# Patient Record
Sex: Female | Born: 1955 | Race: White | Hispanic: No | Marital: Married | Smoking: Never smoker
Health system: Southern US, Community
[De-identification: ages and names within clinical notes are randomized; demographics above are authoritative.]

## PROBLEM LIST (undated history)

## (undated) DIAGNOSIS — I10 Essential (primary) hypertension: Secondary | ICD-10-CM

## (undated) DIAGNOSIS — E119 Type 2 diabetes mellitus without complications: Secondary | ICD-10-CM

---

## 1999-01-12 ENCOUNTER — Encounter: Admission: RE | Admit: 1999-01-12 | Discharge: 1999-04-12 | Payer: Self-pay | Admitting: Family Medicine

## 2005-08-09 ENCOUNTER — Other Ambulatory Visit: Admission: RE | Admit: 2005-08-09 | Discharge: 2005-08-09 | Payer: Self-pay | Admitting: Family Medicine

## 2011-01-17 ENCOUNTER — Other Ambulatory Visit: Payer: Self-pay | Admitting: Family Medicine

## 2011-01-17 DIAGNOSIS — R0989 Other specified symptoms and signs involving the circulatory and respiratory systems: Secondary | ICD-10-CM

## 2011-01-22 ENCOUNTER — Ambulatory Visit
Admission: RE | Admit: 2011-01-22 | Discharge: 2011-01-22 | Disposition: A | Discharge status: Home / Self Care | Payer: BC Managed Care – PPO | Source: Ambulatory Visit | Attending: Family Medicine | Admitting: Family Medicine

## 2011-01-22 DIAGNOSIS — R0989 Other specified symptoms and signs involving the circulatory and respiratory systems: Secondary | ICD-10-CM

## 2012-12-03 IMAGING — US US CAROTID DUPLEX BILAT
1 series · 13 of 24 positions shown · non-contrast
Comparison: None available

CLINICAL DATA: Hypertension, diabetes

BILATERAL CAROTID DUPLEX ULTRASOUND
TECHNIQUE: Gray scale imaging, color Doppler and duplex ultrasound
was performed of bilateral carotid and vertebral arteries in the
neck.

[Series 1: us carotid duplex bilat · 0.08mm/px · 13 of 52 slices shown]
[im 1/52]
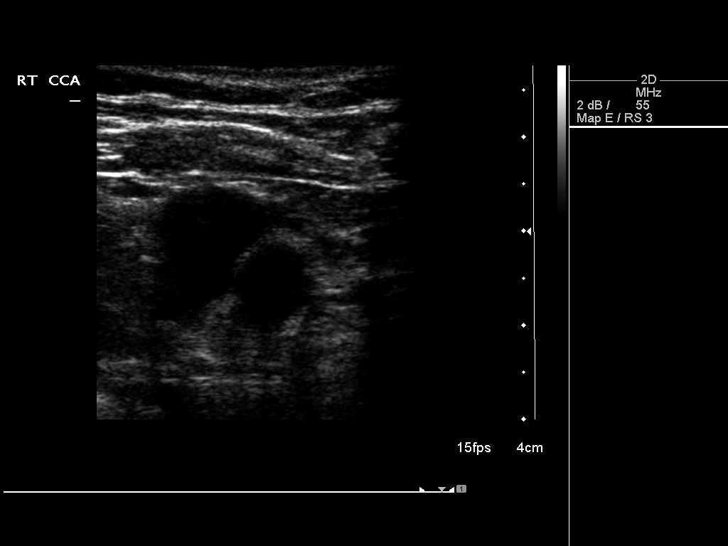
[im 5/52]
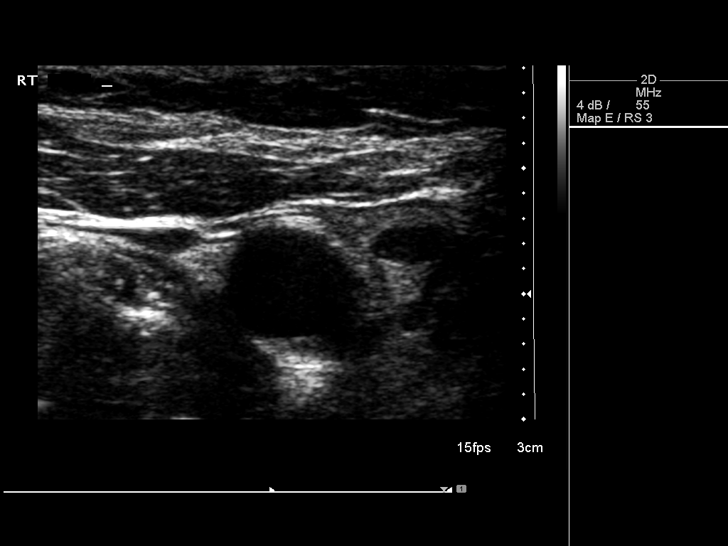
[im 9/52]
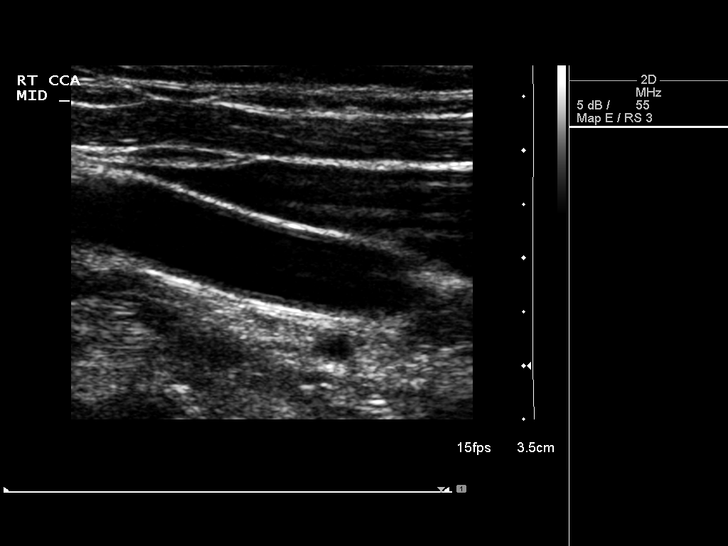
[im 14/52]
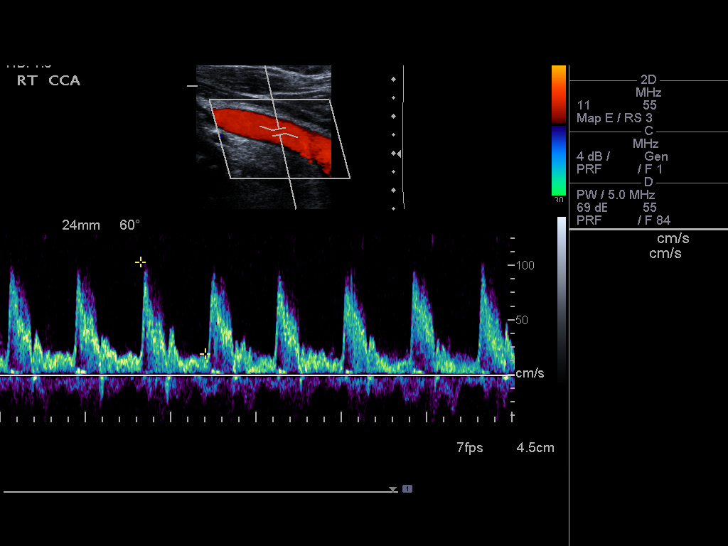
[im 18/52]
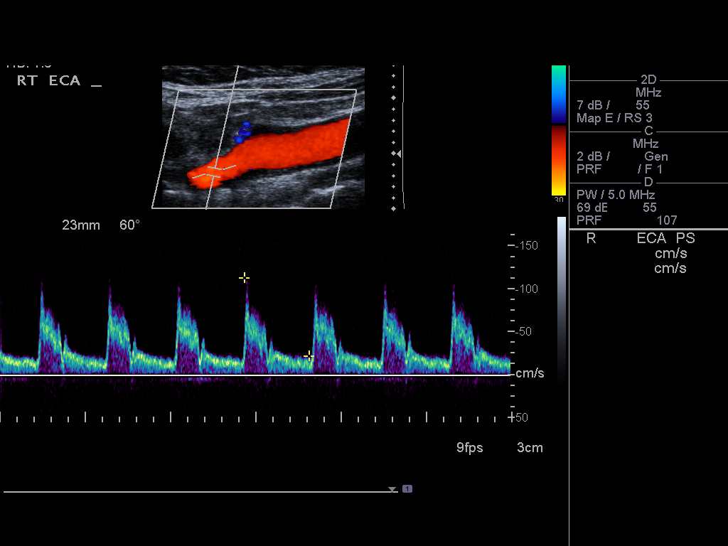
[im 23/52]
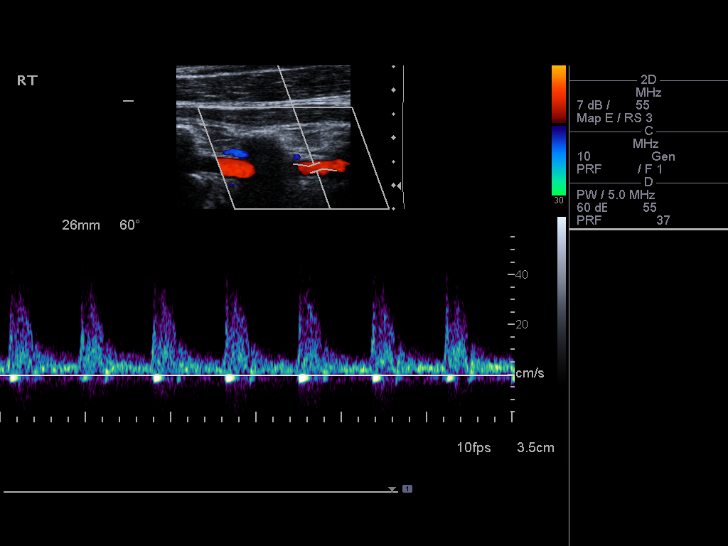
[im 27/52]
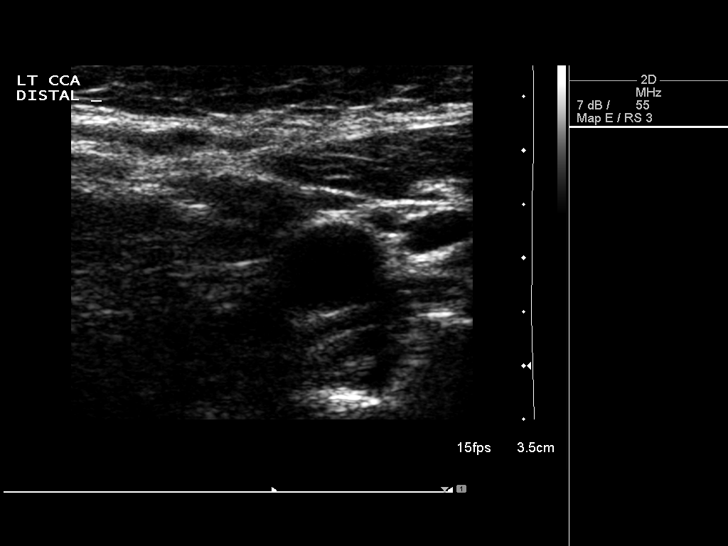
[im 29/52]
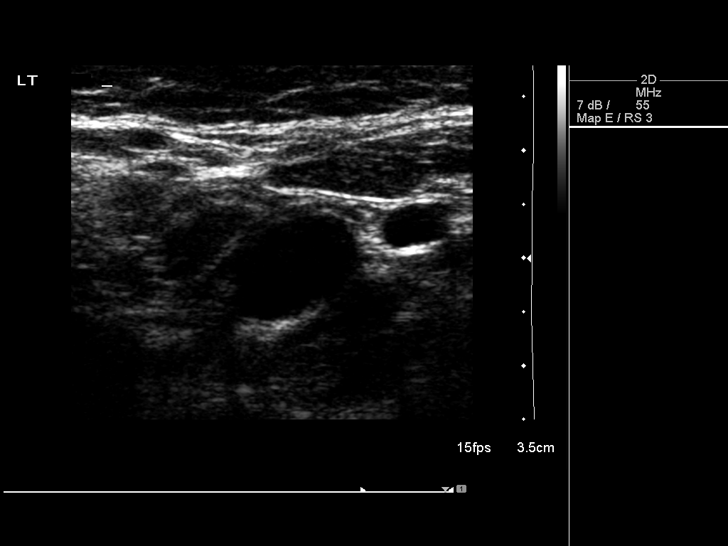
[im 34/52]
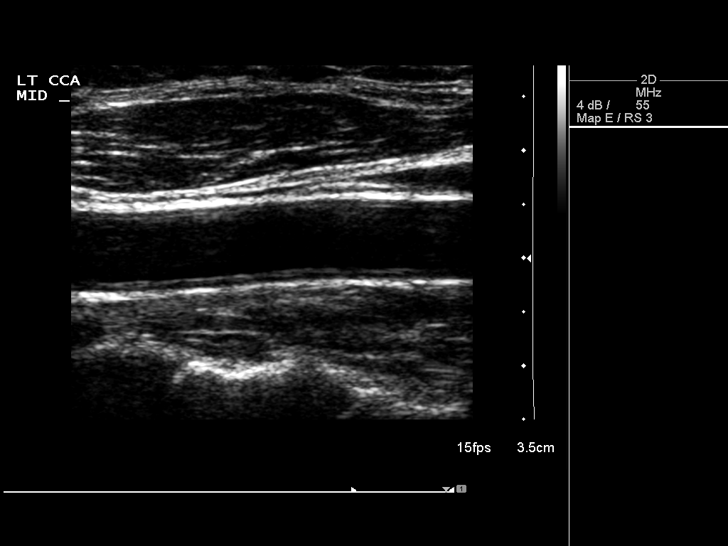
[im 38/52]
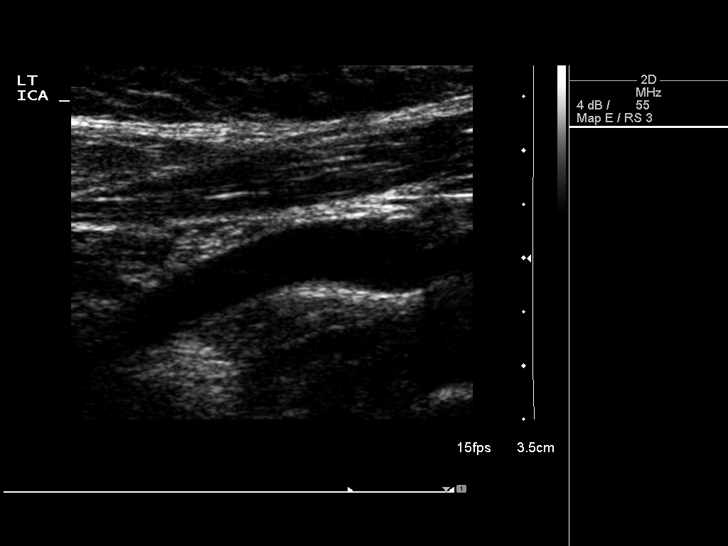
[im 43/52]
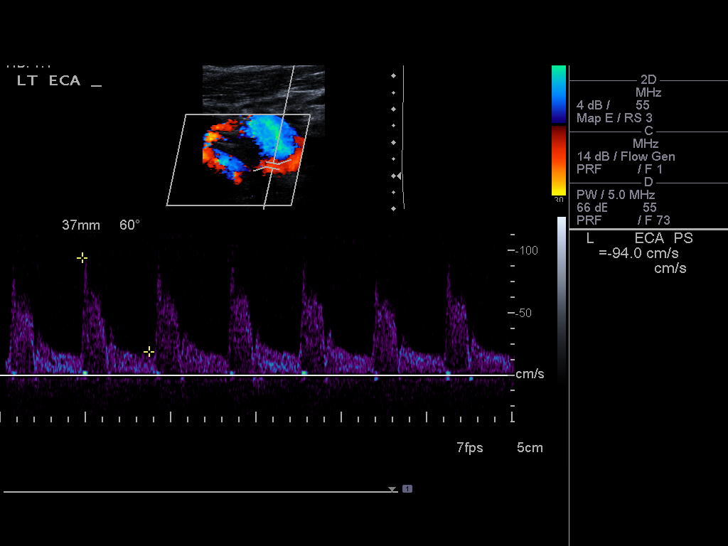
[im 47/52]
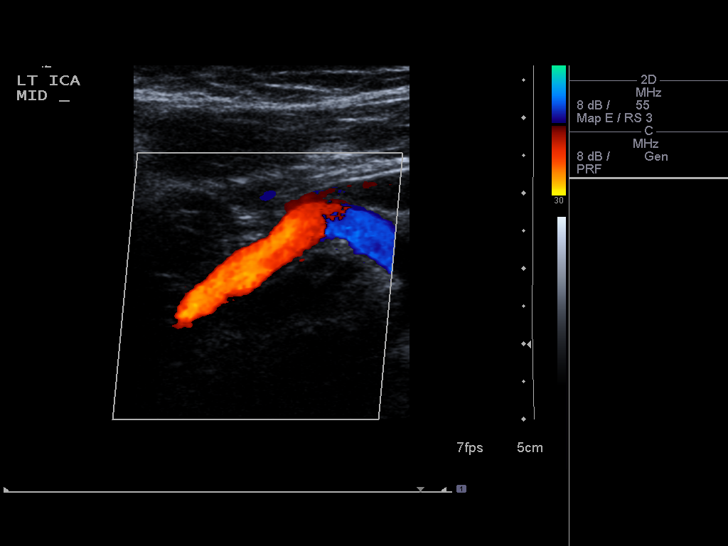
[im 52/52]
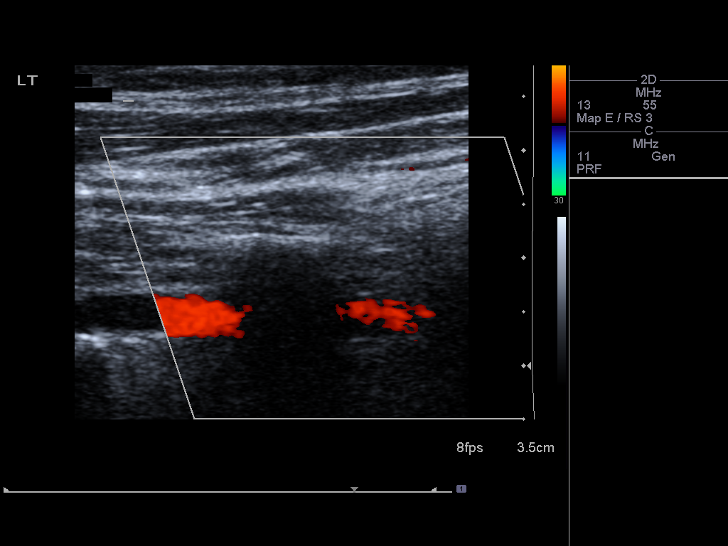

[13 of 24 positions shown; findings below may reference images not displayed]

Criteria:  Quantification of carotid stenosis is based on velocity
parameters that correlate the residual internal carotid diameter
with NASCET-based stenosis levels, using the diameter of the distal
internal carotid lumen as the denominator for stenosis measurement.

The following velocity measurements were obtained:

                 PEAK SYSTOLIC/END DIASTOLIC
RIGHT
ICA:                        78/29cm/sec
CCA:                        92/19cm/sec
SYSTOLIC ICA/CCA RATIO:
DIASTOLIC ICA/CCA RATIO:
ECA:                        114cm/sec

LEFT
ICA:                        70/22cm/sec
CCA:                        112/26cm/sec
SYSTOLIC ICA/CCA RATIO:
DIASTOLIC ICA/CCA RATIO:
ECA:                        94cm/sec
FINDINGS: RIGHT CAROTID ARTERY: No significant plaque or stenosis.  Normal
wave forms and color Doppler signal.

RIGHT VERTEBRAL ARTERY:  Normal flow direction and waveform.

LEFT CAROTID ARTERY: No significant plaque or stenosis.  Normal
wave forms and color Doppler signal.  Mild tortuosity of the ICA.

LEFT VERTEBRAL ARTERY:  Normal flow direction and waveform.
IMPRESSION: 1.  No significant carotid bifurcation plaque or stenosis.

## 2012-12-08 ENCOUNTER — Other Ambulatory Visit: Payer: Self-pay | Admitting: Family Medicine

## 2012-12-08 ENCOUNTER — Other Ambulatory Visit (HOSPITAL_COMMUNITY)
Admission: RE | Admit: 2012-12-08 | Discharge: 2012-12-08 | Disposition: A | Discharge status: Home / Self Care | Payer: BC Managed Care – PPO | Source: Ambulatory Visit | Attending: Family Medicine | Admitting: Family Medicine

## 2012-12-08 DIAGNOSIS — Z1151 Encounter for screening for human papillomavirus (HPV): Secondary | ICD-10-CM | POA: Insufficient documentation

## 2012-12-08 DIAGNOSIS — Z124 Encounter for screening for malignant neoplasm of cervix: Secondary | ICD-10-CM | POA: Insufficient documentation

## 2013-07-02 ENCOUNTER — Other Ambulatory Visit: Payer: Self-pay | Admitting: Dermatology

## 2013-07-15 ENCOUNTER — Other Ambulatory Visit: Payer: Self-pay | Admitting: Dermatology

## 2017-05-13 ENCOUNTER — Other Ambulatory Visit: Payer: Self-pay | Admitting: Family Medicine

## 2017-05-13 ENCOUNTER — Other Ambulatory Visit (HOSPITAL_COMMUNITY)
Admission: RE | Admit: 2017-05-13 | Discharge: 2017-05-13 | Disposition: A | Discharge status: Home / Self Care | Payer: BC Managed Care – PPO | Source: Ambulatory Visit | Attending: Family Medicine | Admitting: Family Medicine

## 2017-05-13 DIAGNOSIS — Z124 Encounter for screening for malignant neoplasm of cervix: Secondary | ICD-10-CM | POA: Diagnosis present

## 2017-05-14 LAB — CYTOLOGY - PAP
DIAGNOSIS: NEGATIVE
HPV (WINDOPATH): NOT DETECTED

## 2018-07-02 ENCOUNTER — Other Ambulatory Visit: Payer: Self-pay | Admitting: Family Medicine

## 2018-07-02 DIAGNOSIS — E2839 Other primary ovarian failure: Secondary | ICD-10-CM

## 2018-08-22 ENCOUNTER — Ambulatory Visit
Admission: RE | Admit: 2018-08-22 | Discharge: 2018-08-22 | Disposition: A | Discharge status: Home / Self Care | Payer: BC Managed Care – PPO | Source: Ambulatory Visit | Attending: Family Medicine | Admitting: Family Medicine

## 2018-08-22 DIAGNOSIS — E2839 Other primary ovarian failure: Secondary | ICD-10-CM

## 2021-02-21 ENCOUNTER — Other Ambulatory Visit: Payer: Self-pay | Admitting: Family Medicine

## 2021-02-21 DIAGNOSIS — R1013 Epigastric pain: Secondary | ICD-10-CM

## 2021-03-08 ENCOUNTER — Ambulatory Visit
Admission: RE | Admit: 2021-03-08 | Discharge: 2021-03-08 | Disposition: A | Discharge status: Home / Self Care | Payer: Medicare Other | Source: Ambulatory Visit | Attending: Family Medicine | Admitting: Family Medicine

## 2021-03-08 DIAGNOSIS — R1013 Epigastric pain: Secondary | ICD-10-CM

## 2021-07-07 ENCOUNTER — Ambulatory Visit: Payer: Self-pay | Admitting: Surgery

## 2021-07-07 NOTE — H&P (Signed)
Leslie Floyd [id number]   Referring Provider:  Laurann Montana, MD   Subjective   Chief Complaint: New Consultation (Gallstones )     History of Present Illness:    Very pleasant 65 year old woman with history of diabetes and hypertension who presents for evaluation of biliary colic.  She has had intermittent right upper quadrant pain and queasiness beginning about a year ago.  After her first episode, she went about 6 months without any symptoms, however recently has had more frequent flares which seem to be typically associated with eating but not necessarily fatty foods, last a few hours and are typically right upper quadrant pain followed by queasiness.  She has not had any previous abdominal surgery.  She had an ultrasound in August that shows multiple intraluminal gallstones the largest in the fundus measuring 2.9 cm.  The gallbladder wall at that time was measured at 5.5 cm but was felt to be an over read, common bile duct was 6 to 7 mm with no ductal stones or filling defects.  Liver appearance consistent with hepatic steatosis.   Review of Systems: A complete review of systems was obtained from the patient.  I have reviewed this information and discussed as appropriate with the patient.  See HPI as well for other ROS.   Medical History: Past Medical History:  Diagnosis Date   Diabetes mellitus without complication (CMS-HCC)    Hyperlipidemia    Hypertension     Patient Active Problem List  Diagnosis   Chronic kidney disease due to hypertension   Chronic kidney disease, stage 2 (mild)   Diabetic renal disease (CMS-HCC)   Gallstones   Hyperlipidemia   Postherpetic neuralgia   Primary insomnia   Reactive depression (situational)   Vitamin D deficiency    Past Surgical History:  Procedure Laterality Date   TONSILLECTOMY       No Known Allergies  Current Outpatient Medications on File Prior to Visit  Medication Sig Dispense Refill   sitaGLIPtin-metFORMIN  (JANUMET) 50-1,000 mg tablet 2 tablets     atorvastatin (LIPITOR) 10 MG tablet Take 1 tablet (10 mg total) by mouth once daily     dapsone 25 MG tablet 1 tablet (25 mg total)     DULoxetine (CYMBALTA) 60 MG DR capsule 1 capsule (60 mg total)     lisinopriL (ZESTRIL) 20 MG tablet Take 1 tablet (20 mg total) by mouth once daily     No current facility-administered medications on file prior to visit.    History reviewed. No pertinent family history.   Social History   Tobacco Use  Smoking Status Never  Smokeless Tobacco Never     Social History   Socioeconomic History   Marital status: Unknown  Tobacco Use   Smoking status: Never   Smokeless tobacco: Never  Substance and Sexual Activity   Alcohol use: Yes   Drug use: Never    Objective:    Vitals:   07/07/21 1611  BP: 132/58  Pulse: 105  Temp: 36.4 C (97.5 F)  SpO2: 97%  Weight: 83 kg (183 lb)    There is no height or weight on file to calculate BMI.  Gen: A&Ox3, no distress  Unlabored respirations Abdomen soft and nontender  Assessment and Plan:  Diagnoses and all orders for this visit:  Biliary colic     I recommend proceeding with laparoscopic or robotic cholecystectomy with possible cholangiogram.  Discussed the relevant anatomy using a diagram to demonstrate, and went over surgical  technique.  Discussed risks of surgery including bleeding, pain, scarring, intraabdominal injury specifically to the common bile duct and sequelae, bile leak, conversion to open surgery, failure to resolve symptoms, blood clots/ pulmonary embolus, heart attack, pneumonia, stroke, death. Questions welcomed and answered to patient's satisfaction.  Kimberle Stanfill Carlye Grippe, MD

## 2021-07-07 NOTE — H&P (View-Only) (Signed)
Priscillia Fouch [id number]   Referring Provider:  Laurann Montana, MD   Subjective   Chief Complaint: New Consultation (Gallstones )     History of Present Illness:    Very pleasant 65 year old woman with history of diabetes and hypertension who presents for evaluation of biliary colic.  She has had intermittent right upper quadrant pain and queasiness beginning about a year ago.  After her first episode, she went about 6 months without any symptoms, however recently has had more frequent flares which seem to be typically associated with eating but not necessarily fatty foods, last a few hours and are typically right upper quadrant pain followed by queasiness.  She has not had any previous abdominal surgery.  She had an ultrasound in August that shows multiple intraluminal gallstones the largest in the fundus measuring 2.9 cm.  The gallbladder wall at that time was measured at 5.5 cm but was felt to be an over read, common bile duct was 6 to 7 mm with no ductal stones or filling defects.  Liver appearance consistent with hepatic steatosis.   Review of Systems: A complete review of systems was obtained from the patient.  I have reviewed this information and discussed as appropriate with the patient.  See HPI as well for other ROS.   Medical History: Past Medical History:  Diagnosis Date   Diabetes mellitus without complication (CMS-HCC)    Hyperlipidemia    Hypertension     Patient Active Problem List  Diagnosis   Chronic kidney disease due to hypertension   Chronic kidney disease, stage 2 (mild)   Diabetic renal disease (CMS-HCC)   Gallstones   Hyperlipidemia   Postherpetic neuralgia   Primary insomnia   Reactive depression (situational)   Vitamin D deficiency    Past Surgical History:  Procedure Laterality Date   TONSILLECTOMY       No Known Allergies  Current Outpatient Medications on File Prior to Visit  Medication Sig Dispense Refill   sitaGLIPtin-metFORMIN  (JANUMET) 50-1,000 mg tablet 2 tablets     atorvastatin (LIPITOR) 10 MG tablet Take 1 tablet (10 mg total) by mouth once daily     dapsone 25 MG tablet 1 tablet (25 mg total)     DULoxetine (CYMBALTA) 60 MG DR capsule 1 capsule (60 mg total)     lisinopriL (ZESTRIL) 20 MG tablet Take 1 tablet (20 mg total) by mouth once daily     No current facility-administered medications on file prior to visit.    History reviewed. No pertinent family history.   Social History   Tobacco Use  Smoking Status Never  Smokeless Tobacco Never     Social History   Socioeconomic History   Marital status: Unknown  Tobacco Use   Smoking status: Never   Smokeless tobacco: Never  Substance and Sexual Activity   Alcohol use: Yes   Drug use: Never    Objective:    Vitals:   07/07/21 1611  BP: 132/58  Pulse: 105  Temp: 36.4 C (97.5 F)  SpO2: 97%  Weight: 83 kg (183 lb)    There is no height or weight on file to calculate BMI.  Gen: A&Ox3, no distress  Unlabored respirations Abdomen soft and nontender  Assessment and Plan:  Diagnoses and all orders for this visit:  Biliary colic     I recommend proceeding with laparoscopic or robotic cholecystectomy with possible cholangiogram.  Discussed the relevant anatomy using a diagram to demonstrate, and went over surgical  technique.  Discussed risks of surgery including bleeding, pain, scarring, intraabdominal injury specifically to the common bile duct and sequelae, bile leak, conversion to open surgery, failure to resolve symptoms, blood clots/ pulmonary embolus, heart attack, pneumonia, stroke, death. Questions welcomed and answered to patient's satisfaction.  Montague Corella AMANDA Caira Poche, MD    

## 2021-07-19 NOTE — Progress Notes (Signed)
Surgical Instructions    Your procedure is scheduled on Wednesday, January 4th.  Report to [facility] Main Entrance "A" at 6:30 A.M., then check in with the Admitting office.  Call this number if you have problems the morning of surgery:  [phone]   If you have any questions prior to your surgery date call [phone]: Open Monday-Friday 8am-4pm    Remember:  Do not eat or drink after midnight the night before your surgery     Take these medicines the morning of surgery with A SIP OF WATER Atorvastatin (Lipitor) Dapsone Duloxetine (Cymbalta)  If needed: Tylenol    As of today, STOP taking any Aspirin (unless otherwise instructed by your surgeon) Aleve, Naproxen, Ibuprofen, Motrin, Advil, Goody's, BC's, all herbal medications, fish oil, and all vitamins.  WHAT DO I DO ABOUT MY DIABETES MEDICATION?   Do not take oral diabetes medicines (pills) the morning of surgery - Sitagliptin-Metformin (Janumet)  THE DAY BEFORE SURGERY, do NOT take Sitagliptin-Metformin (Janumet)      HOW TO MANAGE YOUR DIABETES BEFORE AND AFTER SURGERY  Why is it important to control my blood sugar before and after surgery? Improving blood sugar levels before and after surgery helps healing and can limit problems. A way of improving blood sugar control is eating a healthy diet by:  Eating less sugar and carbohydrates  Increasing activity/exercise  Talking with your doctor about reaching your blood sugar goals High blood sugars (greater than 180 mg/dL) can raise your risk of infections and slow your recovery, so you will need to focus on controlling your diabetes during the weeks before surgery. Make sure that the doctor who takes care of your diabetes knows about your planned surgery including the date and location.  How do I manage my blood sugar before surgery? Check your blood sugar at least 4 times a day, starting 2 days before surgery, to make sure that the level is not too high or  low.  Check your blood sugar the morning of your surgery when you wake up and every 2 hours until you get to the Short Stay unit.  If your blood sugar is less than 70 mg/dL, you will need to treat for low blood sugar: Do not take insulin. Treat a low blood sugar (less than 70 mg/dL) with  cup of clear juice (cranberry or apple), 4 glucose tablets, OR glucose gel. Recheck blood sugar in 15 minutes after treatment (to make sure it is greater than 70 mg/dL). If your blood sugar is not greater than 70 mg/dL on recheck, call [phone] for further instructions. Report your blood sugar to the short stay nurse when you get to Short Stay.  If you are admitted to the hospital after surgery: Your blood sugar will be checked by the staff and you will probably be given insulin after surgery (instead of oral diabetes medicines) to make sure you have good blood sugar levels. The goal for blood sugar control after surgery is 80-180 mg/dL.           DAY OF SURGERY: Do not wear jewelry or makeup Do not wear lotions, powders, perfumes, or deodorant. Do not shave 48 hours prior to surgery.   Do not bring valuables to the hospital. DO Not wear nail polish, gel polish, artificial nails, or any other type of covering on natural nails including finger and toenails. If patients have artificial nails, gel coating, etc. that need to be removed by a nail salon, please have this removed prior to  surgery or surgery may need to be canceled/delayed if the surgeon/ anesthesia feels like the patient is unable to be adequately monitored.             [facility] is not responsible for any belongings or valuables.  Do NOT Smoke (Tobacco/Vaping)  24 hours prior to your procedure  If you use a CPAP at night, you may bring your mask for your overnight stay.   Contacts, glasses, hearing aids, dentures or partials may not be worn into surgery, please bring cases for these belongings   For patients admitted to the hospital,  discharge time will be determined by your treatment team.   Patients discharged the day of surgery will not be allowed to drive home, and someone needs to stay with them for 24 hours.  NO VISITORS WILL BE ALLOWED IN PRE-OP WHERE PATIENTS ARE PREPPED FOR SURGERY.  ONLY 1 SUPPORT PERSON MAY BE PRESENT IN THE WAITING ROOM WHILE YOU ARE IN SURGERY.  IF YOU ARE TO BE ADMITTED, ONCE YOU ARE IN YOUR ROOM YOU WILL BE ALLOWED TWO (2) VISITORS. 1 (ONE) VISITOR MAY STAY OVERNIGHT BUT MUST ARRIVE TO THE ROOM BY 8pm.  Minor children may have two parents present. Special consideration for safety and communication needs will be reviewed on a case by case basis.  Special instructions:    Oral Hygiene is also important to reduce your risk of infection.  Remember - BRUSH YOUR TEETH THE MORNING OF SURGERY WITH YOUR REGULAR TOOTHPASTE   [place]- Preparing For Surgery  Before surgery, you can play an important role. Because skin is not sterile, your skin needs to be as free of germs as possible. You can reduce the number of germs on your skin by washing with CHG (chlorahexidine gluconate) Soap before surgery.  CHG is an antiseptic cleaner which kills germs and bonds with the skin to continue killing germs even after washing.     Please do not use if you have an allergy to CHG or antibacterial soaps. If your skin becomes reddened/irritated stop using the CHG.  Do not shave (including legs and underarms) for at least 48 hours prior to first CHG shower. It is OK to shave your face.  Please follow these instructions carefully.     Shower the NIGHT BEFORE SURGERY and the MORNING OF SURGERY with CHG Soap.   If you chose to wash your hair, wash your hair first as usual with your normal shampoo. After you shampoo, rinse your hair and body thoroughly to remove the shampoo.  Then [employer] and genitals (private parts) with your normal soap and rinse thoroughly to remove soap.  After that Use CHG Soap as you would any  other liquid soap. You can apply CHG directly to the skin and wash gently with a scrungie or a clean washcloth.   Apply the CHG Soap to your body ONLY FROM THE NECK DOWN.  Do not use on open wounds or open sores. Avoid contact with your eyes, ears, mouth and genitals (private parts). Wash Face and genitals (private parts)  with your normal soap.   Wash thoroughly, paying special attention to the area where your surgery will be performed.  Thoroughly rinse your body with warm water from the neck down.  DO NOT shower/wash with your normal soap after using and rinsing off the CHG Soap.  Pat yourself dry with a CLEAN TOWEL.  Wear CLEAN PAJAMAS to bed the night before surgery  Place CLEAN SHEETS on your  bed the night before your surgery  DO NOT SLEEP WITH PETS.   Day of Surgery:  Take a shower with CHG soap. Wear Clean/Comfortable clothing the morning of surgery Do not apply any deodorants/lotions.   Remember to brush your teeth WITH YOUR REGULAR TOOTHPASTE.   Please read over the following fact sheets that you were given.

## 2021-07-20 ENCOUNTER — Inpatient Hospital Stay (HOSPITAL_COMMUNITY)
Admission: RE | Admit: 2021-07-20 | Discharge: 2021-07-20 | Disposition: A | Discharge status: Home / Self Care | Payer: Medicare Other | Source: Ambulatory Visit

## 2021-07-26 ENCOUNTER — Other Ambulatory Visit: Payer: Self-pay

## 2021-07-26 ENCOUNTER — Encounter (HOSPITAL_COMMUNITY)
Admission: RE | Admit: 2021-07-26 | Discharge: 2021-07-26 | Disposition: A | Discharge status: Home / Self Care | Payer: Medicare Other | Source: Ambulatory Visit | Attending: Surgery | Admitting: Surgery

## 2021-07-26 ENCOUNTER — Encounter (HOSPITAL_COMMUNITY): Payer: Self-pay

## 2021-07-26 VITALS — BP 132/67 | HR 78 | Temp 98.2°F | Resp 18 | Ht 63.5 in | Wt 178.9 lb

## 2021-07-26 DIAGNOSIS — E119 Type 2 diabetes mellitus without complications: Secondary | ICD-10-CM | POA: Diagnosis not present

## 2021-07-26 DIAGNOSIS — Z01818 Encounter for other preprocedural examination: Secondary | ICD-10-CM | POA: Diagnosis not present

## 2021-07-26 HISTORY — DX: Essential (primary) hypertension: I10

## 2021-07-26 HISTORY — DX: Type 2 diabetes mellitus without complications: E11.9

## 2021-07-26 LAB — CBC
HCT: 34.9 % — ABNORMAL LOW (ref 36.0–46.0)
Hemoglobin: 11.5 g/dL — ABNORMAL LOW (ref 12.0–15.0)
MCH: 33 pg (ref 26.0–34.0)
MCHC: 33 g/dL (ref 30.0–36.0)
MCV: 100 fL (ref 80.0–100.0)
Platelets: 285 10*3/uL (ref 150–400)
RBC: 3.49 MIL/uL — ABNORMAL LOW (ref 3.87–5.11)
RDW: 12.1 % (ref 11.5–15.5)
WBC: 5.8 10*3/uL (ref 4.0–10.5)
nRBC: 0 % (ref 0.0–0.2)

## 2021-07-26 LAB — BASIC METABOLIC PANEL
Anion gap: 8 (ref 5–15)
BUN: 16 mg/dL (ref 8–23)
CO2: 25 mmol/L (ref 22–32)
Calcium: 9.3 mg/dL (ref 8.9–10.3)
Chloride: 102 mmol/L (ref 98–111)
Creatinine, Ser: 0.9 mg/dL (ref 0.44–1.00)
GFR, Estimated: >60 mL/min (ref >60)
Glucose, Bld: 206 mg/dL — ABNORMAL HIGH (ref 70–99)
Potassium: 4.2 mmol/L (ref 3.5–5.1)
Sodium: 135 mmol/L (ref 135–145)

## 2021-07-26 LAB — GLUCOSE, CAPILLARY: Glucose-Capillary: 203 mg/dL — ABNORMAL HIGH (ref 70–99)

## 2021-07-26 LAB — HEMOGLOBIN A1C
Hgb A1c MFr Bld: 7.1 % — ABNORMAL HIGH (ref 4.8–5.6)
Mean Plasma Glucose: 157.07 mg/dL

## 2021-07-26 NOTE — Progress Notes (Signed)
PCP: Laurann Montana, MD Cardiologist: denies  EKG: 07/26/21 CXR: na ECHO: denies Stress Test: denies Cardiac Cath: denies  Fasting Blood Sugar- 170's Checks Blood Sugar__2_ times a week, 1 time a day mid-morning  ASA/Blood Thinner: No  OSA/CPAP: No  Covid test not needed - ambulatory  Anesthesia Review: No  Patient denies shortness of breath, fever, cough, and chest pain at PAT appointment.  Patient verbalized understanding of instructions provided today at the PAT appointment.  Patient asked to review instructions at home and day of surgery.

## 2021-08-01 NOTE — Anesthesia Preprocedure Evaluation (Addendum)
Anesthesia Evaluation  Patient identified by MRN, date of birth, ID band Patient awake    Reviewed: Allergy & Precautions, H&P , NPO status , Patient's Chart, lab work & pertinent test results  Airway Mallampati: III  TM Distance: >3 FB Neck ROM: Full    Dental no notable dental hx. (+) Teeth Intact, Dental Advisory Given   Pulmonary neg pulmonary ROS,    Pulmonary exam normal breath sounds clear to auscultation       Cardiovascular Exercise Tolerance: Good hypertension, Pt. on medications  Rhythm:Regular Rate:Normal     Neuro/Psych negative neurological ROS  negative psych ROS   GI/Hepatic negative GI ROS, Neg liver ROS,   Endo/Other  diabetes, Type 2, Oral Hypoglycemic Agents  Renal/GU negative Renal ROS  negative genitourinary   Musculoskeletal   Abdominal   Peds  Hematology negative hematology ROS (+)   Anesthesia Other Findings   Reproductive/Obstetrics negative OB ROS                            Anesthesia Physical Anesthesia Plan  ASA: 2  Anesthesia Plan: General   Post-op Pain Management: Tylenol PO (pre-op)   Induction: Intravenous  PONV Risk Score and Plan: 4 or greater and Ondansetron, Dexamethasone and Midazolam  Airway Management Planned: Oral ETT  Additional Equipment:   Intra-op Plan:   Post-operative Plan: Extubation in OR  Informed Consent: I have reviewed the patients History and Physical, chart, labs and discussed the procedure including the risks, benefits and alternatives for the proposed anesthesia with the patient or authorized representative who has indicated his/her understanding and acceptance.     Dental advisory given  Plan Discussed with: CRNA  Anesthesia Plan Comments:        Anesthesia Quick Evaluation

## 2021-08-02 ENCOUNTER — Encounter (HOSPITAL_COMMUNITY)
Admission: RE | Disposition: A | Discharge status: Home / Self Care | Payer: Self-pay | Source: Ambulatory Visit | Attending: Surgery

## 2021-08-02 ENCOUNTER — Other Ambulatory Visit: Payer: Self-pay

## 2021-08-02 ENCOUNTER — Ambulatory Visit (HOSPITAL_COMMUNITY): Payer: Medicare PPO | Admitting: Anesthesiology

## 2021-08-02 ENCOUNTER — Encounter (HOSPITAL_COMMUNITY): Payer: Self-pay | Admitting: Surgery

## 2021-08-02 ENCOUNTER — Ambulatory Visit (HOSPITAL_COMMUNITY)
Admission: RE | Admit: 2021-08-02 | Discharge: 2021-08-02 | Disposition: A | Discharge status: Home / Self Care | Payer: Medicare PPO | Source: Ambulatory Visit | Attending: Surgery | Admitting: Surgery

## 2021-08-02 DIAGNOSIS — K8020 Calculus of gallbladder without cholecystitis without obstruction: Secondary | ICD-10-CM | POA: Diagnosis present

## 2021-08-02 DIAGNOSIS — K8010 Calculus of gallbladder with chronic cholecystitis without obstruction: Secondary | ICD-10-CM | POA: Insufficient documentation

## 2021-08-02 DIAGNOSIS — I10 Essential (primary) hypertension: Secondary | ICD-10-CM | POA: Insufficient documentation

## 2021-08-02 DIAGNOSIS — E119 Type 2 diabetes mellitus without complications: Secondary | ICD-10-CM | POA: Diagnosis not present

## 2021-08-02 DIAGNOSIS — Z7984 Long term (current) use of oral hypoglycemic drugs: Secondary | ICD-10-CM | POA: Diagnosis not present

## 2021-08-02 LAB — GLUCOSE, CAPILLARY
Glucose-Capillary: 191 mg/dL — ABNORMAL HIGH (ref 70–99)
Glucose-Capillary: 237 mg/dL — ABNORMAL HIGH (ref 70–99)

## 2021-08-02 SURGERY — CHOLECYSTECTOMY, ROBOT-ASSISTED, LAPAROSCOPIC
Anesthesia: General | Site: Abdomen

## 2021-08-02 MED ORDER — GABAPENTIN 300 MG PO CAPS: 300.0000 mg | ORAL_CAPSULE | ORAL | Status: AC

## 2021-08-02 MED ORDER — ACETAMINOPHEN 500 MG PO TABS
1000.0000 mg | ORAL_TABLET | ORAL | Status: AC
  Administered 2021-08-02: 1000 mg via ORAL
  Filled 2021-08-02: qty 2

## 2021-08-02 MED ORDER — CHLORHEXIDINE GLUCONATE 0.12 % MT SOLN
OROMUCOSAL | Status: AC
  Administered 2021-08-02: 15 mL via OROMUCOSAL
  Filled 2021-08-02: qty 15

## 2021-08-02 MED ORDER — CHLORHEXIDINE GLUCONATE 0.12 % MT SOLN: 15.0000 mL | Freq: Once | OROMUCOSAL | Status: AC

## 2021-08-02 MED ORDER — BUPIVACAINE LIPOSOME 1.3 % IJ SUSP
INTRAMUSCULAR | Status: DC | PRN
  Administered 2021-08-02: 40 mL
  Administered 2021-08-02: 10 mL

## 2021-08-02 MED ORDER — MIDAZOLAM HCL 5 MG/5ML IJ SOLN
INTRAMUSCULAR | Status: DC | PRN
Start: 2021-08-02 — End: 2021-08-02
  Administered 2021-08-02: 2 mg via INTRAVENOUS

## 2021-08-02 MED ORDER — CHLORHEXIDINE GLUCONATE 4 % EX LIQD: 60.0000 mL | Freq: Once | CUTANEOUS | Status: DC

## 2021-08-02 MED ORDER — ORAL CARE MOUTH RINSE: 15.0000 mL | Freq: Once | OROMUCOSAL | Status: AC

## 2021-08-02 MED ORDER — OXYCODONE HCL 5 MG PO TABS
ORAL_TABLET | ORAL | Status: AC
  Filled 2021-08-02: qty 1

## 2021-08-02 MED ORDER — LACTATED RINGERS IV SOLN: INTRAVENOUS | Status: DC

## 2021-08-02 MED ORDER — SUGAMMADEX SODIUM 200 MG/2ML IV SOLN
INTRAVENOUS | Status: DC | PRN
Start: 2021-08-02 — End: 2021-08-02
  Administered 2021-08-02: 200 mg via INTRAVENOUS

## 2021-08-02 MED ORDER — INDOCYANINE GREEN 25 MG IV SOLR
2.5000 mg | Freq: Once | INTRAVENOUS | Status: DC
  Filled 2021-08-02: qty 10

## 2021-08-02 MED ORDER — PROPOFOL 10 MG/ML IV BOLUS
INTRAVENOUS | Status: DC | PRN
  Administered 2021-08-02: 120 mg via INTRAVENOUS

## 2021-08-02 MED ORDER — DOCUSATE SODIUM 100 MG PO CAPS: 100.0000 mg | ORAL_CAPSULE | Freq: Two times a day (BID) | ORAL | 0 refills | Status: AC

## 2021-08-02 MED ORDER — BUPIVACAINE HCL (PF) 0.25 % IJ SOLN
INTRAMUSCULAR | Status: AC
  Filled 2021-08-02: qty 30

## 2021-08-02 MED ORDER — OXYCODONE HCL 5 MG PO TABS
5.0000 mg | ORAL_TABLET | ORAL | Status: DC | PRN
  Administered 2021-08-02: 5 mg via ORAL

## 2021-08-02 MED ORDER — PROPOFOL 10 MG/ML IV BOLUS
INTRAVENOUS | Status: AC
  Filled 2021-08-02: qty 20

## 2021-08-02 MED ORDER — 0.9 % SODIUM CHLORIDE (POUR BTL) OPTIME
TOPICAL | Status: DC | PRN
  Administered 2021-08-02: 1000 mL

## 2021-08-02 MED ORDER — BUPIVACAINE LIPOSOME 1.3 % IJ SUSP
20.0000 mL | Freq: Once | INTRAMUSCULAR | Status: DC
  Filled 2021-08-02: qty 20

## 2021-08-02 MED ORDER — BUPIVACAINE LIPOSOME 1.3 % IJ SUSP
INTRAMUSCULAR | Status: AC
  Filled 2021-08-02: qty 20

## 2021-08-02 MED ORDER — PHENYLEPHRINE 40 MCG/ML (10ML) SYRINGE FOR IV PUSH (FOR BLOOD PRESSURE SUPPORT)
PREFILLED_SYRINGE | INTRAVENOUS | Status: AC
  Filled 2021-08-02: qty 10

## 2021-08-02 MED ORDER — STERILE WATER FOR IRRIGATION IR SOLN
Status: DC | PRN
  Administered 2021-08-02: 200 mL

## 2021-08-02 MED ORDER — MIDAZOLAM HCL 2 MG/2ML IJ SOLN
INTRAMUSCULAR | Status: AC
  Filled 2021-08-02: qty 2

## 2021-08-02 MED ORDER — DEXAMETHASONE SODIUM PHOSPHATE 10 MG/ML IJ SOLN
INTRAMUSCULAR | Status: DC | PRN
  Administered 2021-08-02: 4 mg via INTRAVENOUS

## 2021-08-02 MED ORDER — ROCURONIUM BROMIDE 10 MG/ML (PF) SYRINGE
PREFILLED_SYRINGE | INTRAVENOUS | Status: DC | PRN
  Administered 2021-08-02: 10 mg via INTRAVENOUS
  Administered 2021-08-02: 20 mg via INTRAVENOUS
  Administered 2021-08-02: 60 mg via INTRAVENOUS

## 2021-08-02 MED ORDER — FENTANYL CITRATE (PF) 100 MCG/2ML IJ SOLN
INTRAMUSCULAR | Status: DC | PRN
Start: 2021-08-02 — End: 2021-08-02
  Administered 2021-08-02 (×5): 50 ug via INTRAVENOUS

## 2021-08-02 MED ORDER — LIDOCAINE 2% (20 MG/ML) 5 ML SYRINGE
INTRAMUSCULAR | Status: AC
  Filled 2021-08-02: qty 5

## 2021-08-02 MED ORDER — LIDOCAINE 2% (20 MG/ML) 5 ML SYRINGE
INTRAMUSCULAR | Status: DC | PRN
Start: 2021-08-02 — End: 2021-08-02
  Administered 2021-08-02: 60 mg via INTRAVENOUS

## 2021-08-02 MED ORDER — ROCURONIUM BROMIDE 10 MG/ML (PF) SYRINGE
PREFILLED_SYRINGE | INTRAVENOUS | Status: AC
  Filled 2021-08-02: qty 10

## 2021-08-02 MED ORDER — CEFAZOLIN SODIUM-DEXTROSE 2-4 GM/100ML-% IV SOLN
2.0000 g | INTRAVENOUS | Status: AC
  Administered 2021-08-02: 2 g via INTRAVENOUS
  Filled 2021-08-02: qty 100

## 2021-08-02 MED ORDER — PHENYLEPHRINE HCL (PRESSORS) 10 MG/ML IV SOLN
INTRAVENOUS | Status: DC | PRN
  Administered 2021-08-02: 80 ug via INTRAVENOUS
  Administered 2021-08-02: 120 ug via INTRAVENOUS
  Administered 2021-08-02: 40 ug via INTRAVENOUS

## 2021-08-02 MED ORDER — GABAPENTIN 300 MG PO CAPS
ORAL_CAPSULE | ORAL | Status: AC
  Administered 2021-08-02: 300 mg via ORAL
  Filled 2021-08-02: qty 1

## 2021-08-02 MED ORDER — DEXAMETHASONE SODIUM PHOSPHATE 10 MG/ML IJ SOLN
INTRAMUSCULAR | Status: AC
  Filled 2021-08-02: qty 1

## 2021-08-02 MED ORDER — FENTANYL CITRATE (PF) 250 MCG/5ML IJ SOLN
INTRAMUSCULAR | Status: AC
  Filled 2021-08-02: qty 5

## 2021-08-02 MED ORDER — ONDANSETRON HCL 4 MG/2ML IJ SOLN
INTRAMUSCULAR | Status: DC | PRN
  Administered 2021-08-02: 4 mg via INTRAVENOUS

## 2021-08-02 MED ORDER — OXYCODONE HCL 5 MG PO TABS
5.0000 mg | ORAL_TABLET | Freq: Three times a day (TID) | ORAL | 0 refills | Status: AC | PRN
Start: 2021-08-02 — End: 2022-08-02

## 2021-08-02 MED ORDER — HYDROMORPHONE HCL 1 MG/ML IJ SOLN: 0.2500 mg | INTRAMUSCULAR | Status: DC | PRN

## 2021-08-02 MED ORDER — ONDANSETRON HCL 4 MG/2ML IJ SOLN
INTRAMUSCULAR | Status: AC
  Filled 2021-08-02: qty 2

## 2021-08-02 SURGICAL SUPPLY — 36 items
CHLORAPREP W/TINT 26 (MISCELLANEOUS) ×2 IMPLANT
CLIP LIGATING HEM O LOK PURPLE (MISCELLANEOUS) ×3 IMPLANT
COVER MAYO STAND STRL (DRAPES) ×2 IMPLANT
COVER TIP SHEARS 8 DVNC (MISCELLANEOUS) ×1 IMPLANT
COVER TIP SHEARS 8MM DA VINCI (MISCELLANEOUS) ×2
DERMABOND ADVANCED (GAUZE/BANDAGES/DRESSINGS) ×1
DERMABOND ADVANCED .7 DNX12 (GAUZE/BANDAGES/DRESSINGS) ×1 IMPLANT
DRAPE ARM DVNC X/XI (DISPOSABLE) ×4 IMPLANT
DRAPE COLUMN DVNC XI (DISPOSABLE) ×1 IMPLANT
DRAPE CV SPLIT W-CLR ANES SCRN (DRAPES) ×1 IMPLANT
DRAPE DA VINCI XI ARM (DISPOSABLE) ×8
DRAPE DA VINCI XI COLUMN (DISPOSABLE) ×2
DRAPE ORTHO SPLIT 77X108 STRL (DRAPES) ×2
DRAPE SURG ORHT 6 SPLT 77X108 (DRAPES) IMPLANT
GLOVE SURG ENC MOIS LTX SZ6 (GLOVE) ×2 IMPLANT
GLOVE SURG MICRO LTX SZ6 (GLOVE) ×2 IMPLANT
GLOVE SURG UNDER LTX SZ6.5 (GLOVE) ×2 IMPLANT
GOWN STRL REUS W/ TWL LRG LVL3 (GOWN DISPOSABLE) ×3 IMPLANT
GOWN STRL REUS W/TWL LRG LVL3 (GOWN DISPOSABLE) ×6
GRASPER SUT TROCAR 14GX15 (MISCELLANEOUS) ×1 IMPLANT
IRRIG SUCT STRYKERFLOW 2 WTIP (MISCELLANEOUS) ×2
IRRIGATION SUCT STRKRFLW 2 WTP (MISCELLANEOUS) IMPLANT
KIT BASIN OR (CUSTOM PROCEDURE TRAY) ×2 IMPLANT
KIT TURNOVER KIT B (KITS) IMPLANT
MARKER SKIN DUAL TIP RULER LAB (MISCELLANEOUS) ×2 IMPLANT
NDL INSUFFLATION 14GA 120MM (NEEDLE) ×1 IMPLANT
NEEDLE INSUFFLATION 14GA 120MM (NEEDLE) ×2 IMPLANT
POUCH RETRIEVAL ECOSAC 10 (ENDOMECHANICALS) ×1 IMPLANT
POUCH RETRIEVAL ECOSAC 10MM (ENDOMECHANICALS) ×2
SEAL CANN UNIV 5-8 DVNC XI (MISCELLANEOUS) ×4 IMPLANT
SEAL XI 5MM-8MM UNIVERSAL (MISCELLANEOUS) ×8
SET TUBE SMOKE EVAC HIGH FLOW (TUBING) ×2 IMPLANT
STOPCOCK 4 WAY LG BORE MALE ST (IV SETS) ×2 IMPLANT
SUT MNCRL AB 4-0 PS2 18 (SUTURE) ×2 IMPLANT
TOWEL GREEN STERILE (TOWEL DISPOSABLE) ×2 IMPLANT
TRAY LAPAROSCOPIC MC (CUSTOM PROCEDURE TRAY) ×2 IMPLANT

## 2021-08-02 NOTE — Transfer of Care (Signed)
Immediate Anesthesia Transfer of Care Note  Patient: Leslie Floyd  Procedure(s) Performed: XI ROBOTIC ASSISTED LAPAROSCOPIC CHOLECYSTECTOMY (Abdomen)  Patient Location: PACU  Anesthesia Type:General  Level of Consciousness: drowsy and patient cooperative  Airway & Oxygen Therapy: Patient Spontanous Breathing  Post-op Assessment: Report given to RN and Post -op Vital signs reviewed and stable  Post vital signs: Reviewed and stable  Last Vitals:  Vitals Value Taken Time  BP 163/71 08/02/21 1036  Temp    Pulse 90 08/02/21 1037  Resp 18 08/02/21 1037  SpO2 89 % 08/02/21 1037  Vitals shown include unvalidated device data.  Last Pain:  Vitals:   08/02/21 0707  TempSrc:   PainSc: 0-No pain      Patients Stated Pain Goal: 0 ([id number])  Complications: No notable events documented.

## 2021-08-02 NOTE — Anesthesia Procedure Notes (Signed)
Procedure Name: Intubation Date/Time: 08/02/2021 8:30 AM Performed by: Lynnell Chad, CRNA Pre-anesthesia Checklist: Patient identified, Emergency Drugs available, Suction available and Patient being monitored Patient Re-evaluated:Patient Re-evaluated prior to induction Oxygen Delivery Method: Circle System Utilized Preoxygenation: Pre-oxygenation with 100% oxygen Induction Type: IV induction Ventilation: Mask ventilation without difficulty Laryngoscope Size: Miller and 2 Grade View: Grade I Tube type: Oral Tube size: 7.0 mm Number of attempts: 1 Airway Equipment and Method: Stylet and Oral airway Placement Confirmation: ETT inserted through vocal cords under direct vision, positive ETCO2 and breath sounds checked- equal and bilateral Secured at: 20 cm Tube secured with: Tape Dental Injury: Teeth and Oropharynx as per pre-operative assessment  Comments: Intubation by SRNA, stephanie buerk

## 2021-08-02 NOTE — Anesthesia Postprocedure Evaluation (Signed)
Anesthesia Post Note  Patient: Leslie Floyd  Procedure(s) Performed: XI ROBOTIC ASSISTED LAPAROSCOPIC CHOLECYSTECTOMY (Abdomen)     Patient location during evaluation: PACU Anesthesia Type: General Level of consciousness: awake and alert Pain management: pain level controlled Vital Signs Assessment: post-procedure vital signs reviewed and stable Respiratory status: spontaneous breathing, nonlabored ventilation and respiratory function stable Cardiovascular status: blood pressure returned to baseline and stable Postop Assessment: no apparent nausea or vomiting Anesthetic complications: no   No notable events documented.  Last Vitals:  Vitals:   08/02/21 1048 08/02/21 1103  BP: (!) 154/65 (!) 158/73  Pulse: 79 72  Resp: 18 14  Temp:  36.7 C  SpO2: 92% 92%    Last Pain:  Vitals:   08/02/21 1103  TempSrc:   PainSc: 0-No pain                 [name],W. EDMOND

## 2021-08-02 NOTE — Discharge Instructions (Signed)
LAPAROSCOPIC SURGERY: POST OP INSTRUCTIONS   EAT Gradually transition to a high fiber diet with a fiber supplement over the next few weeks after discharge.  Start with a pureed / full liquid diet (see below)  WALK Walk an hour a day.  Control your pain to do that.    CONTROL PAIN Control pain so that you can walk, sleep, tolerate sneezing/coughing, go up/down stairs.  HAVE A BOWEL MOVEMENT DAILY Keep your bowels regular to avoid problems.  OK to try a laxative to override constipation.  OK to use an antidairrheal to slow down diarrhea.  Call if not better after 2 tries  CALL IF YOU HAVE PROBLEMS/CONCERNS Call if you are still struggling despite following these instructions. Call if you have concerns not answered by these instructions    DIET: Follow a light bland diet & liquids the first 24 hours after arrival home, such as soup, liquids, starches, etc.  Be sure to drink plenty of fluids.  Quickly advance to a usual solid diet within a few days.  Avoid fast food or heavy meals as your are more likely to get nauseated or have irregular bowels.  A low-sugar, high-fiber diet for the rest of your life is ideal.  Take your usually prescribed home medications unless otherwise directed.  PAIN CONTROL: Pain is best controlled by a usual combination of three different methods TOGETHER: Ice/Heat Over the counter pain medication Prescription pain medication Most patients will experience some swelling and bruising around the incisions.  Ice packs or heating pads (30-60 minutes up to 6 times a day) will help. Use ice for the first few days to help decrease swelling and bruising, then switch to heat to help relax tight/sore spots and speed recovery.  Some people prefer to use ice alone, heat alone, alternating between ice & heat.  Experiment to what works for you.  Swelling and bruising can take several weeks to resolve.   It is helpful to take an over-the-counter pain medication regularly for the  first few days: Naproxen (Aleve, etc)  Two 220mg tabs twice a day OR Ibuprofen (Advil, etc) Three 200mg tabs four times a day (every meal & bedtime) AND Acetaminophen (Tylenol, etc) 500-650mg four times a day (every meal & bedtime) A  prescription for pain medication (such as oxycodone, hydrocodone, tramadol, gabapentin, methocarbamol, etc) should be given to you upon discharge.  Take your pain medication as prescribed, IF NEEDED.  If you are having problems/concerns with the prescription medicine (does not control pain, nausea, vomiting, rash, itching, etc), please call us [phone] to see if we need to switch you to a different pain medicine that will work better for you and/or control your side effect better. If you need a refill on your pain medication, please give us 48 hour notice.  contact your pharmacy.  They will contact our office to request authorization. Prescriptions will not be filled after 5 pm or on week-ends  Avoid getting constipated.   Between the surgery and the pain medications, it is common to experience some constipation.   Increasing fluid intake and taking a fiber supplement (such as Metamucil, Citrucel, FiberCon, MiraLax, etc) 1-2 times a day regularly will usually help prevent this problem from occurring.   A mild laxative (prune juice, Milk of Magnesia, MiraLax, etc) should be taken according to package directions if there are no bowel movements after 48 hours.   Watch out for diarrhea.   If you have many loose bowel movements, simplify your diet   to bland foods & liquids for a few days.   Stop any stool softeners and decrease your fiber supplement.   Switching to mild anti-diarrheal medications (Kayopectate, Pepto Bismol) can help.   If this worsens or does not improve, please call us.  Wash / shower every day.  You may shower over the skin glue which is waterproof  Glue will flake off after about 2 weeks.  You may leave the incision open to air.  You may replace a  dressing/Band-Aid to cover the incision for comfort if you wish.   ACTIVITIES as tolerated:   You may resume regular (light) daily activities beginning the next day--such as daily self-care, walking, climbing stairs--gradually increasing activities as tolerated.  If you can walk 30 minutes without difficulty, it is safe to try more intense activity such as jogging, treadmill, bicycling, low-impact aerobics, swimming, etc. Save the most intensive and strenuous activity for last such as sit-ups, heavy lifting, contact sports, etc  Refrain from any heavy lifting or straining until you are off narcotics for pain control.   DO NOT PUSH THROUGH PAIN.  Let pain be your guide: If it hurts to do something, don't do it.  Pain is your body warning you to avoid that activity for another week until the pain goes down. You may drive when you are no longer taking prescription pain medication, you can comfortably wear a seatbelt, and you can safely maneuver your car and apply brakes. You may have sexual intercourse when it is comfortable.  FOLLOW UP in our office Please call [facility] at [phone] to set up an appointment to see your surgeon in the office for a follow-up appointment approximately 2-3 weeks after your surgery. Make sure that you call for this appointment the day you arrive home to insure a convenient appointment time.  10. IF YOU HAVE DISABILITY OR FAMILY LEAVE FORMS, BRING THEM TO THE OFFICE FOR PROCESSING.  DO NOT GIVE THEM TO YOUR DOCTOR.   WHEN TO CALL US [phone]: Poor pain control Reactions / problems with new medications (rash/itching, nausea, etc)  Fever over 101.5 F (38.5 C) Inability to urinate Nausea and/or vomiting Worsening swelling or bruising Continued bleeding from incision. Increased pain, redness, or drainage from the incision   The clinic staff is available to answer your questions during regular business hours (8:30am-5pm).  Please don't hesitate to call and ask to  speak to one of our nurses for clinical concerns.   If you have a medical emergency, go to the nearest emergency room or call 911.  A surgeon from [facility] is always on call at the hospitals   [facility] [address], [address], [place], [place]  [address] ? MAIN: [phone] ? TOLL FREE: [phone] ?  FAX [phone] [email or web address]     

## 2021-08-02 NOTE — Interval H&P Note (Signed)
History and Physical Interval Note:  08/02/2021 7:51 AM  Leslie Floyd  has presented today for surgery, with the diagnosis of BILIARY COLIC.  The various methods of treatment have been discussed with the patient and family. After consideration of risks, benefits and other options for treatment, the patient has consented to  Procedure(s): XI ROBOTIC ASSISTED LAPAROSCOPIC CHOLECYSTECTOMY (N/A) as a surgical intervention.  The patient's history has been reviewed, patient examined, no change in status, stable for surgery.  I have reviewed the patient's chart and labs.  Questions were answered to the patient's satisfaction.     Kemond Amorin Lollie Sails

## 2021-08-02 NOTE — Op Note (Signed)
Operative Note  Leslie Floyd  [id number]  [id number]  08/02/2021   Surgeon: Berna Bue MD   Assistant: Berenda Morale RNFA   Procedure performed: robotic cholecystectomy   Preop diagnosis: biliary colic Post-op diagnosis/intraop findings: chronic cholecystitis   Specimens: gallbladder Retained items: no  EBL: minimal cc Complications: none   Description of procedure:  After obtaining informed consent the patient was brought to the operating room. Antibiotics were administered. SCD's were applied. General endotracheal anesthesia was initiated and a formal time-out was performed. The abdomen was prepped and draped in the usual sterile fashion and peritoneal access gained using a left subcostal veress needle after instilling the site with local. Insufflation to was obtained, periumbilical 40mm trocar and camera inserted, and gross inspection revealed no evidence of injury from our entry or other intraabdominal abnormalities.  Bilateral laparoscopic assisted taps blocks were performed with Exparel mixed with quarter percent Marcaine.  Three additional 8 mm robotictrocars were introduced in the right and left midclavicular and right anterior axillary lines under direct visualization and following infiltration with local.  The robot was then docked and instruments inserted under direct visualization.   The gallbladder was noted to be pale, thickened, and contained at least 1 very large stone and had an appearance consistent with chronic cholecystitis.  The fundus was retracted cephalad and the infundibulum was retracted laterally. A combination of hook electrocautery and blunt dissection was utilized to clear the peritoneum from the neck and cystic duct, circumferentially isolating the cystic artery and cystic duct and lifting the gallbladder from the cystic plate.  This was somewhat tedious as the patient did have a shortened broad cystic duct and the gallbladder was somewhat adherent to  the porta where there was an aberrant vessel crossing anterior to the common bile duct towards the liver.  The structures were carefully protected throughout the dissection.  The critical view of safety was achieved with the cystic artery, cystic duct, and liver bed visualized between them with no other structures.  This was concordant with the view on firefly.  The artery was clipped with a single clip proximally and distally and divided as was the cystic duct with three clips on the proximal end. The gallbladder was dissected from the liver plate using electrocautery.  A likely accessory duct was identified along the proximal body of the gallbladder and this was clipped proximally before being divided distally with cautery.  Hemostasis along the liver bed was ensured with cautery as the dissection progressed. Once freed the gallbladder was placed in an endocatch bag and removed through the left upper quadrant trocar site, which did have to be extended to extract the enlarged stone.  Hemostasis was once again confirmed, and reinspection of the abdomen revealed no injuries. The clips were well opposed without any bile leak from the duct or the liver bed on direct visualization nor on firefly.  A minimal amount of thick, murky bile and small granular stones which had been spilled from the gallbladder body during dissection from the liver bed was aspirated the liver bed was inspected for hemostasis; small bleeding points were addressed with cautery and hemostasis was ensured.  The right upper quadrant was irrigated with 1 L of warm sterile saline and aspirated, the effluent was clear.  The left upper quadrant extraction port site was closed with 3 simple interrupted 0 vicryls in the fascia under direct visualization using a PMI device. The abdomen was desufflated and all trocars removed. The skin incisions were closed  with subcuticular monocryl and Dermabond. The patient was awakened, extubated and transported to the  recovery room in stable condition.   All counts were correct at the end of the case

## 2021-08-03 LAB — SURGICAL PATHOLOGY

## 2022-01-23 DIAGNOSIS — Z79899 Other long term (current) drug therapy: Secondary | ICD-10-CM | POA: Diagnosis not present

## 2022-01-23 DIAGNOSIS — Z411 Encounter for cosmetic surgery: Secondary | ICD-10-CM | POA: Diagnosis not present

## 2022-01-23 DIAGNOSIS — L814 Other melanin hyperpigmentation: Secondary | ICD-10-CM | POA: Diagnosis not present

## 2022-01-23 DIAGNOSIS — L130 Dermatitis herpetiformis: Secondary | ICD-10-CM | POA: Diagnosis not present

## 2022-03-06 DIAGNOSIS — I129 Hypertensive chronic kidney disease with stage 1 through stage 4 chronic kidney disease, or unspecified chronic kidney disease: Secondary | ICD-10-CM | POA: Diagnosis not present

## 2022-03-06 DIAGNOSIS — E1121 Type 2 diabetes mellitus with diabetic nephropathy: Secondary | ICD-10-CM | POA: Diagnosis not present

## 2022-03-06 DIAGNOSIS — B0229 Other postherpetic nervous system involvement: Secondary | ICD-10-CM | POA: Diagnosis not present

## 2022-03-06 DIAGNOSIS — F4321 Adjustment disorder with depressed mood: Secondary | ICD-10-CM | POA: Diagnosis not present

## 2022-03-06 DIAGNOSIS — E785 Hyperlipidemia, unspecified: Secondary | ICD-10-CM | POA: Diagnosis not present

## 2022-03-06 DIAGNOSIS — N182 Chronic kidney disease, stage 2 (mild): Secondary | ICD-10-CM | POA: Diagnosis not present

## 2022-03-06 DIAGNOSIS — G479 Sleep disorder, unspecified: Secondary | ICD-10-CM | POA: Diagnosis not present

## 2022-06-06 DIAGNOSIS — B0229 Other postherpetic nervous system involvement: Secondary | ICD-10-CM | POA: Diagnosis not present

## 2022-06-06 DIAGNOSIS — F4321 Adjustment disorder with depressed mood: Secondary | ICD-10-CM | POA: Diagnosis not present

## 2022-06-06 DIAGNOSIS — F5101 Primary insomnia: Secondary | ICD-10-CM | POA: Diagnosis not present

## 2022-07-05 DIAGNOSIS — F5101 Primary insomnia: Secondary | ICD-10-CM | POA: Diagnosis not present

## 2022-07-05 DIAGNOSIS — F4321 Adjustment disorder with depressed mood: Secondary | ICD-10-CM | POA: Diagnosis not present

## 2022-09-04 DIAGNOSIS — R051 Acute cough: Secondary | ICD-10-CM | POA: Diagnosis not present

## 2022-09-17 DIAGNOSIS — Z1231 Encounter for screening mammogram for malignant neoplasm of breast: Secondary | ICD-10-CM | POA: Diagnosis not present

## 2022-09-18 DIAGNOSIS — E1121 Type 2 diabetes mellitus with diabetic nephropathy: Secondary | ICD-10-CM | POA: Diagnosis not present

## 2022-09-18 DIAGNOSIS — N182 Chronic kidney disease, stage 2 (mild): Secondary | ICD-10-CM | POA: Diagnosis not present

## 2022-09-18 DIAGNOSIS — D539 Nutritional anemia, unspecified: Secondary | ICD-10-CM | POA: Diagnosis not present

## 2022-09-18 DIAGNOSIS — M8588 Other specified disorders of bone density and structure, other site: Secondary | ICD-10-CM | POA: Diagnosis not present

## 2022-09-18 DIAGNOSIS — F5101 Primary insomnia: Secondary | ICD-10-CM | POA: Diagnosis not present

## 2022-09-18 DIAGNOSIS — D649 Anemia, unspecified: Secondary | ICD-10-CM | POA: Diagnosis not present

## 2022-09-18 DIAGNOSIS — E785 Hyperlipidemia, unspecified: Secondary | ICD-10-CM | POA: Diagnosis not present

## 2022-09-18 DIAGNOSIS — E1122 Type 2 diabetes mellitus with diabetic chronic kidney disease: Secondary | ICD-10-CM | POA: Diagnosis not present

## 2022-09-18 DIAGNOSIS — I129 Hypertensive chronic kidney disease with stage 1 through stage 4 chronic kidney disease, or unspecified chronic kidney disease: Secondary | ICD-10-CM | POA: Diagnosis not present

## 2022-09-18 DIAGNOSIS — Z0000 Encounter for general adult medical examination without abnormal findings: Secondary | ICD-10-CM | POA: Diagnosis not present

## 2022-09-18 DIAGNOSIS — E559 Vitamin D deficiency, unspecified: Secondary | ICD-10-CM | POA: Diagnosis not present

## 2022-09-18 DIAGNOSIS — B0229 Other postherpetic nervous system involvement: Secondary | ICD-10-CM | POA: Diagnosis not present

## 2022-09-18 DIAGNOSIS — F4321 Adjustment disorder with depressed mood: Secondary | ICD-10-CM | POA: Diagnosis not present

## 2022-09-27 DIAGNOSIS — L723 Sebaceous cyst: Secondary | ICD-10-CM | POA: Diagnosis not present

## 2022-09-28 DIAGNOSIS — L089 Local infection of the skin and subcutaneous tissue, unspecified: Secondary | ICD-10-CM | POA: Diagnosis not present

## 2022-09-28 DIAGNOSIS — L723 Sebaceous cyst: Secondary | ICD-10-CM | POA: Diagnosis not present

## 2022-10-01 DIAGNOSIS — L723 Sebaceous cyst: Secondary | ICD-10-CM | POA: Diagnosis not present

## 2022-10-01 DIAGNOSIS — L089 Local infection of the skin and subcutaneous tissue, unspecified: Secondary | ICD-10-CM | POA: Diagnosis not present

## 2022-10-08 DIAGNOSIS — Z1211 Encounter for screening for malignant neoplasm of colon: Secondary | ICD-10-CM | POA: Diagnosis not present

## 2022-10-09 DIAGNOSIS — H52203 Unspecified astigmatism, bilateral: Secondary | ICD-10-CM | POA: Diagnosis not present

## 2022-10-09 DIAGNOSIS — H02831 Dermatochalasis of right upper eyelid: Secondary | ICD-10-CM | POA: Diagnosis not present

## 2022-10-09 DIAGNOSIS — E119 Type 2 diabetes mellitus without complications: Secondary | ICD-10-CM | POA: Diagnosis not present

## 2022-10-09 DIAGNOSIS — H02834 Dermatochalasis of left upper eyelid: Secondary | ICD-10-CM | POA: Diagnosis not present

## 2022-10-09 DIAGNOSIS — H2513 Age-related nuclear cataract, bilateral: Secondary | ICD-10-CM | POA: Diagnosis not present

## 2022-11-14 DIAGNOSIS — R195 Other fecal abnormalities: Secondary | ICD-10-CM | POA: Diagnosis not present

## 2022-11-14 DIAGNOSIS — D508 Other iron deficiency anemias: Secondary | ICD-10-CM | POA: Diagnosis not present

## 2022-11-14 DIAGNOSIS — K9189 Other postprocedural complications and disorders of digestive system: Secondary | ICD-10-CM | POA: Diagnosis not present

## 2022-11-14 DIAGNOSIS — R197 Diarrhea, unspecified: Secondary | ICD-10-CM | POA: Diagnosis not present

## 2023-01-18 IMAGING — US US ABDOMEN LIMITED
1 series · 14 of 25 positions shown · non-contrast
Comparison: None.

CLINICAL DATA: Epigastric abdominal pain. Episodic upper abdominal
pain. Rule out gallstones.

EXAM:
ULTRASOUND ABDOMEN LIMITED RIGHT UPPER QUADRANT

[Series 1: us abdomen limited · 0.25mm/px · 14 of 54 slices shown]
[im 1/54]
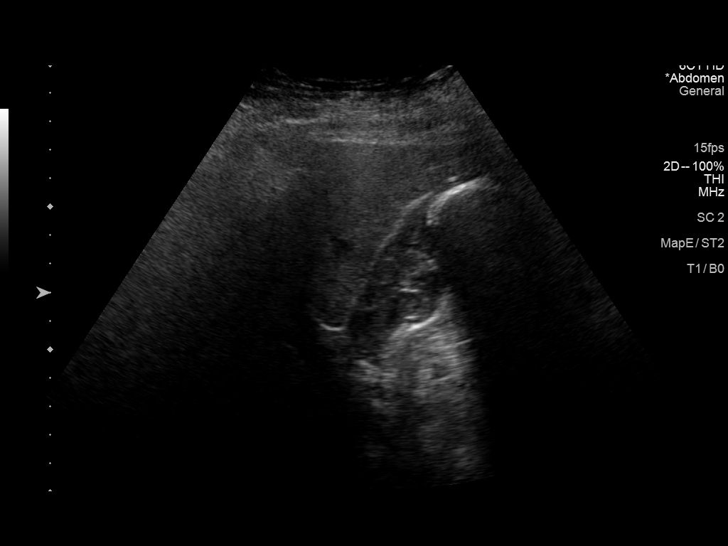
[im 5/54]
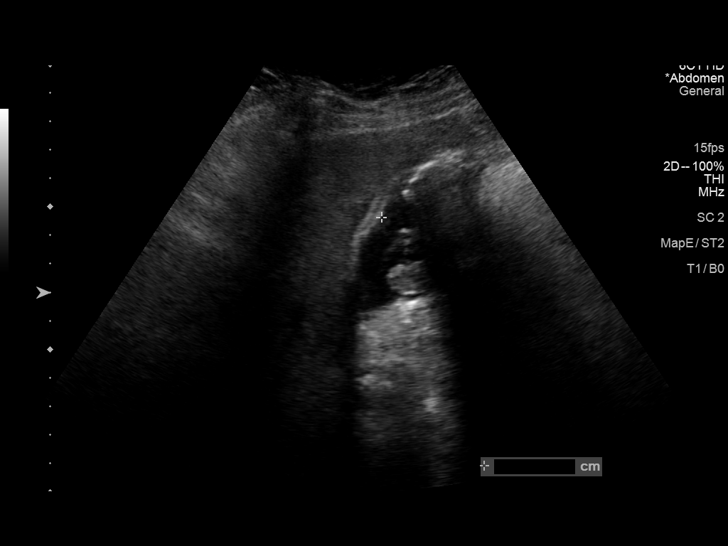
[im 9/54]
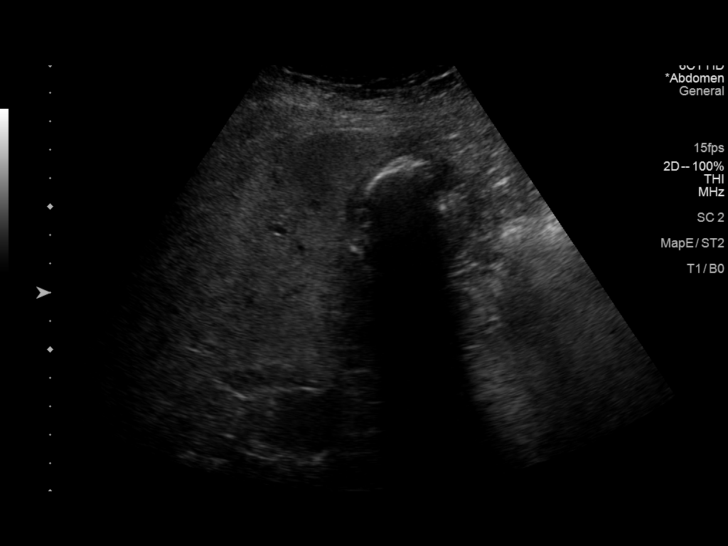
[im 14/54]
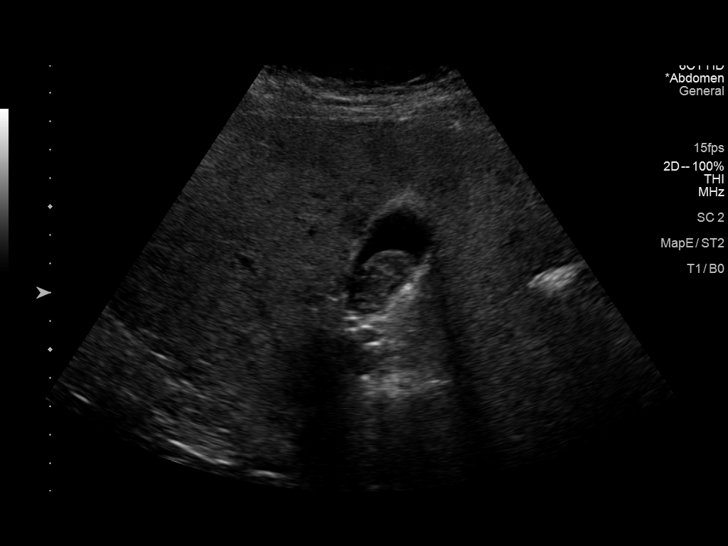
[im 18/54]
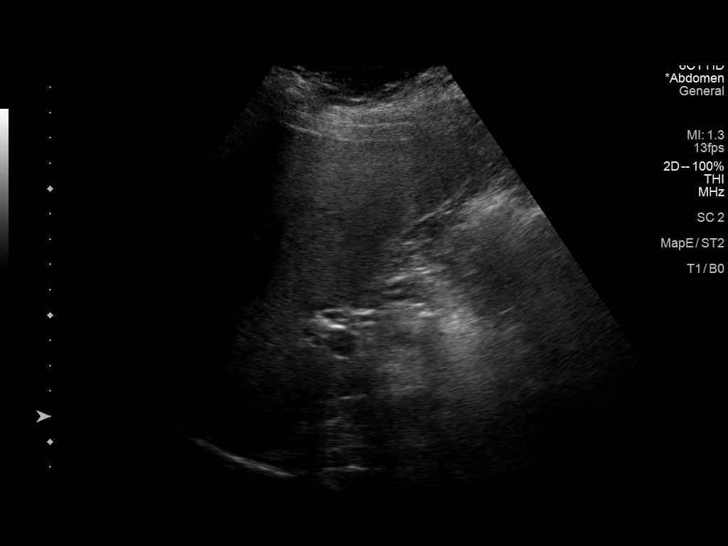
[im 20/54]
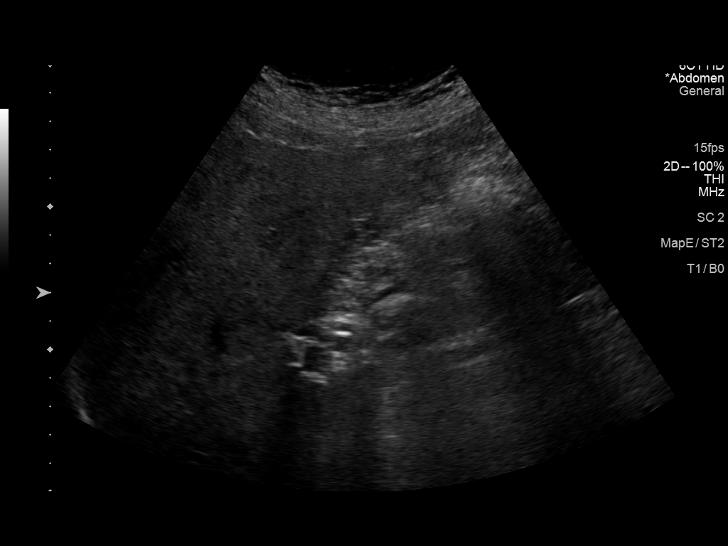
[im 25/54]
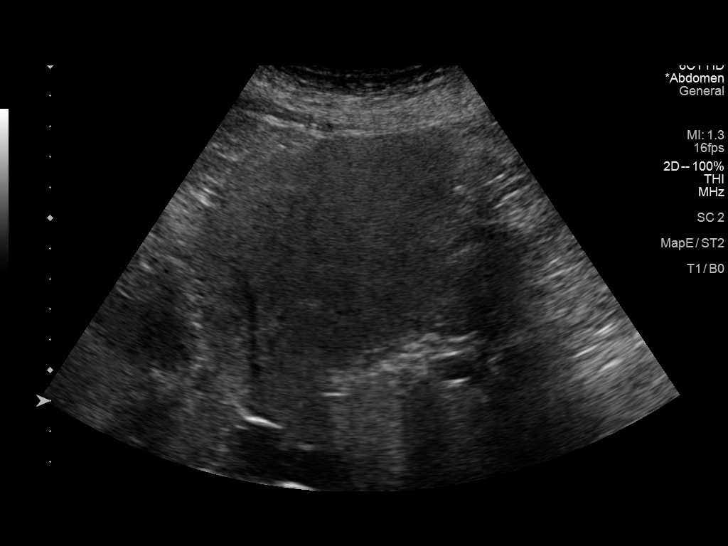
[im 29/54]
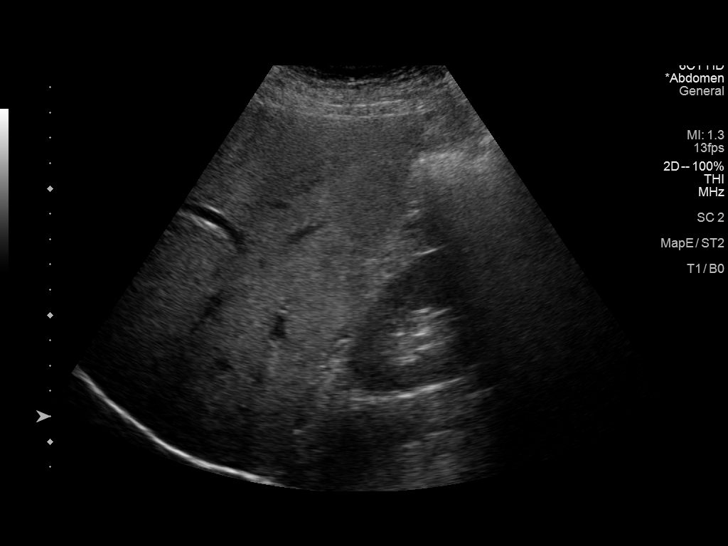
[im 34/54]
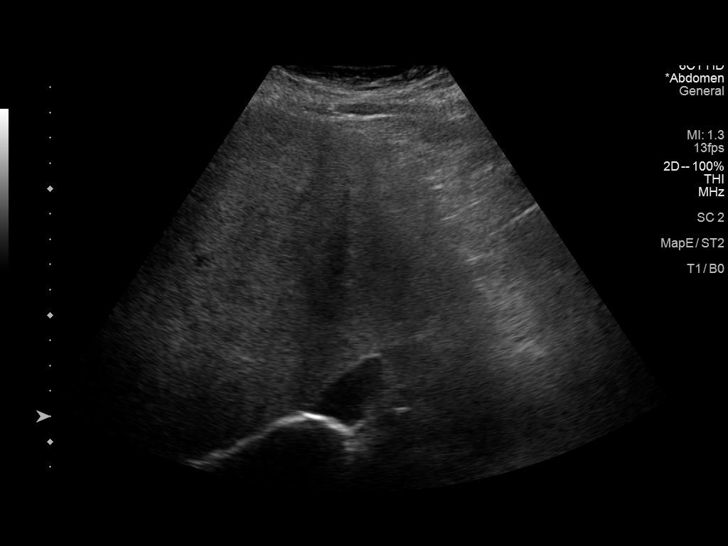
[im 36/54]
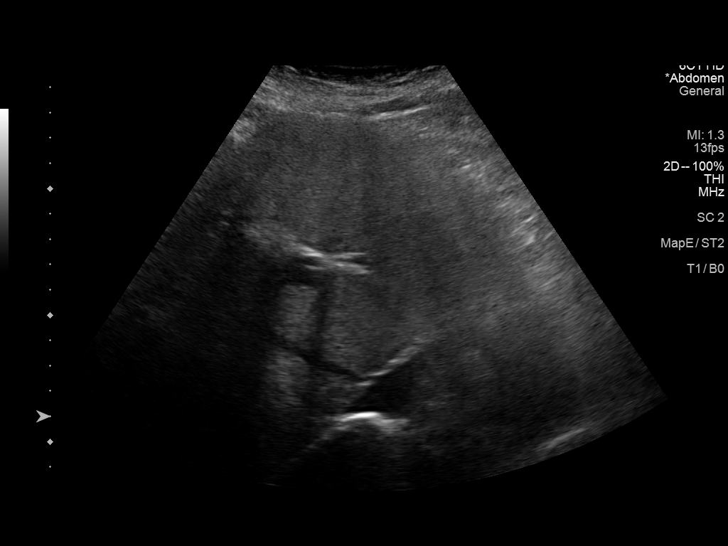
[im 40/54]
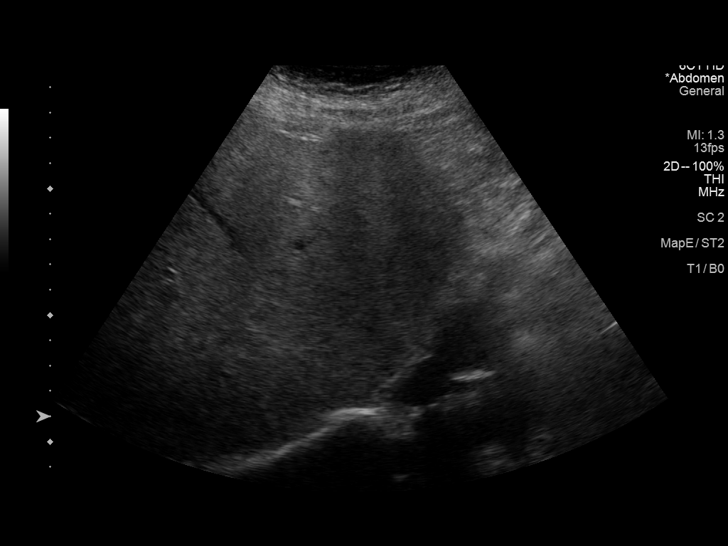
[im 45/54]
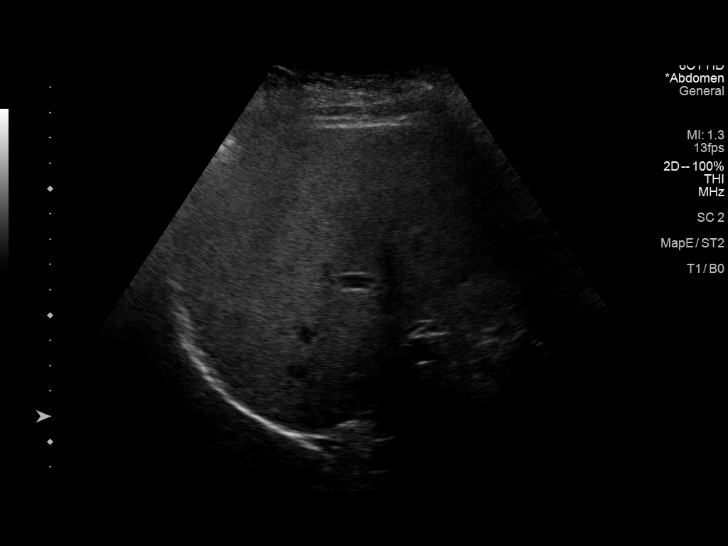
[im 49/54]
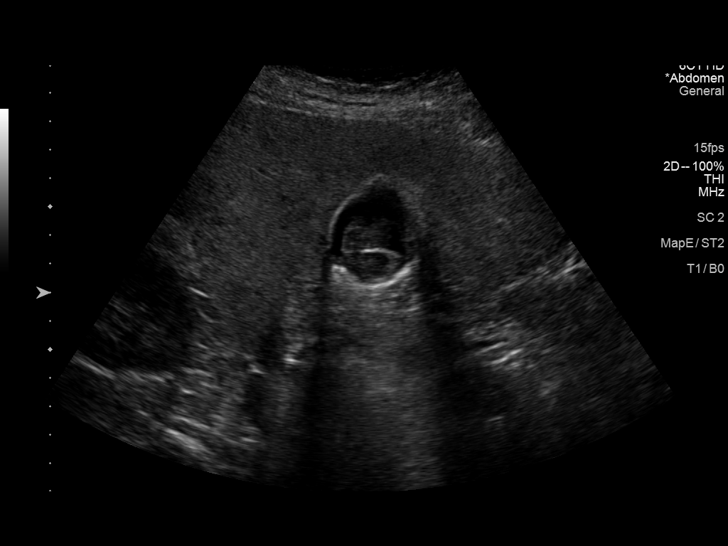
[im 54/54]
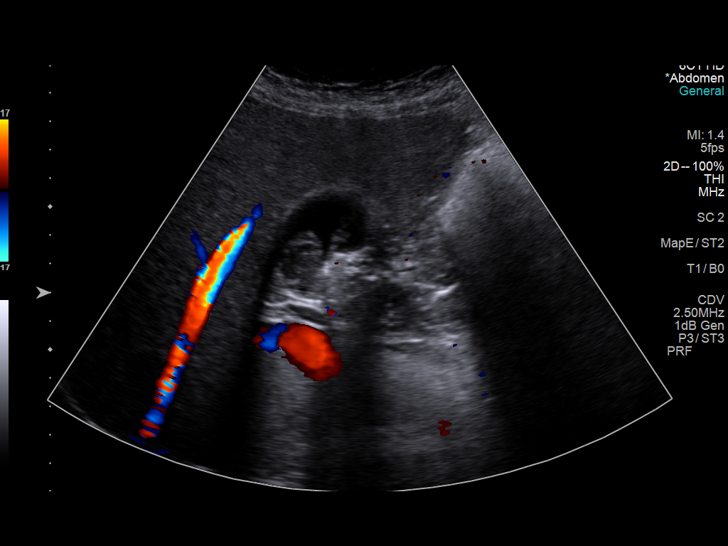

[14 of 25 positions shown; findings below may reference images not displayed]

FINDINGS: Gallbladder:

Multiple intraluminal gallstones, largest in the fundus measuring
2.9 cm. No pericholecystic fluid. Technologist measures gallbladder
wall left 5.5 mm, however this is likely over estimating, and no
definite gallbladder wall thickening is seen. No sonographic Murphy
sign noted by sonographer.

Common bile duct:

Diameter: 6-7 mm, upper normal. No ductal stones or filling defects.

Liver:

Heterogeneous and diffusely increased in parenchymal echogenicity.
The liver parenchyma is difficult to penetrate. No discrete focal
lesion. Portal vein is patent on color Doppler imaging with normal
direction of blood flow towards the liver.

Other: No right upper quadrant ascites.
IMPRESSION: 1. Multiple gallstones. No sonographic evidence of acute
cholecystitis.
2. Upper normal common bile duct. No visualized choledocholithiasis.
3. Prominent hepatic steatosis.

## 2023-01-24 DIAGNOSIS — K3189 Other diseases of stomach and duodenum: Secondary | ICD-10-CM | POA: Diagnosis not present

## 2023-01-24 DIAGNOSIS — D509 Iron deficiency anemia, unspecified: Secondary | ICD-10-CM | POA: Diagnosis not present

## 2023-01-24 DIAGNOSIS — E119 Type 2 diabetes mellitus without complications: Secondary | ICD-10-CM | POA: Diagnosis not present

## 2023-01-24 DIAGNOSIS — D122 Benign neoplasm of ascending colon: Secondary | ICD-10-CM | POA: Diagnosis not present

## 2023-01-24 DIAGNOSIS — K2960 Other gastritis without bleeding: Secondary | ICD-10-CM | POA: Diagnosis not present

## 2023-01-24 DIAGNOSIS — B9681 Helicobacter pylori [H. pylori] as the cause of diseases classified elsewhere: Secondary | ICD-10-CM | POA: Diagnosis not present

## 2023-01-24 DIAGNOSIS — K648 Other hemorrhoids: Secondary | ICD-10-CM | POA: Diagnosis not present

## 2023-01-24 DIAGNOSIS — R195 Other fecal abnormalities: Secondary | ICD-10-CM | POA: Diagnosis not present

## 2023-01-24 DIAGNOSIS — K635 Polyp of colon: Secondary | ICD-10-CM | POA: Diagnosis not present

## 2023-02-04 DIAGNOSIS — Z79899 Other long term (current) drug therapy: Secondary | ICD-10-CM | POA: Diagnosis not present

## 2023-02-04 DIAGNOSIS — L130 Dermatitis herpetiformis: Secondary | ICD-10-CM | POA: Diagnosis not present

## 2023-03-26 DIAGNOSIS — A048 Other specified bacterial intestinal infections: Secondary | ICD-10-CM | POA: Diagnosis not present

## 2023-03-26 DIAGNOSIS — D508 Other iron deficiency anemias: Secondary | ICD-10-CM | POA: Diagnosis not present

## 2023-03-26 DIAGNOSIS — R197 Diarrhea, unspecified: Secondary | ICD-10-CM | POA: Diagnosis not present

## 2023-03-26 DIAGNOSIS — K9189 Other postprocedural complications and disorders of digestive system: Secondary | ICD-10-CM | POA: Diagnosis not present

## 2023-04-02 DIAGNOSIS — E669 Obesity, unspecified: Secondary | ICD-10-CM | POA: Diagnosis not present

## 2023-04-02 DIAGNOSIS — E785 Hyperlipidemia, unspecified: Secondary | ICD-10-CM | POA: Diagnosis not present

## 2023-04-02 DIAGNOSIS — N182 Chronic kidney disease, stage 2 (mild): Secondary | ICD-10-CM | POA: Diagnosis not present

## 2023-04-02 DIAGNOSIS — E1121 Type 2 diabetes mellitus with diabetic nephropathy: Secondary | ICD-10-CM | POA: Diagnosis not present

## 2023-04-02 DIAGNOSIS — D509 Iron deficiency anemia, unspecified: Secondary | ICD-10-CM | POA: Diagnosis not present

## 2023-04-02 DIAGNOSIS — E1122 Type 2 diabetes mellitus with diabetic chronic kidney disease: Secondary | ICD-10-CM | POA: Diagnosis not present

## 2023-04-02 DIAGNOSIS — F5101 Primary insomnia: Secondary | ICD-10-CM | POA: Diagnosis not present

## 2023-04-02 DIAGNOSIS — I129 Hypertensive chronic kidney disease with stage 1 through stage 4 chronic kidney disease, or unspecified chronic kidney disease: Secondary | ICD-10-CM | POA: Diagnosis not present

## 2023-04-02 DIAGNOSIS — B0229 Other postherpetic nervous system involvement: Secondary | ICD-10-CM | POA: Diagnosis not present

## 2023-04-02 DIAGNOSIS — F324 Major depressive disorder, single episode, in partial remission: Secondary | ICD-10-CM | POA: Diagnosis not present

## 2023-04-05 DIAGNOSIS — D508 Other iron deficiency anemias: Secondary | ICD-10-CM | POA: Diagnosis not present

## 2023-04-08 DIAGNOSIS — A048 Other specified bacterial intestinal infections: Secondary | ICD-10-CM | POA: Diagnosis not present

## 2023-04-08 DIAGNOSIS — B9681 Helicobacter pylori [H. pylori] as the cause of diseases classified elsewhere: Secondary | ICD-10-CM | POA: Diagnosis not present

## 2023-04-16 DIAGNOSIS — H02831 Dermatochalasis of right upper eyelid: Secondary | ICD-10-CM | POA: Diagnosis not present

## 2023-04-16 DIAGNOSIS — H02834 Dermatochalasis of left upper eyelid: Secondary | ICD-10-CM | POA: Diagnosis not present

## 2023-05-27 DIAGNOSIS — H02834 Dermatochalasis of left upper eyelid: Secondary | ICD-10-CM | POA: Diagnosis not present

## 2023-05-27 DIAGNOSIS — H53453 Other localized visual field defect, bilateral: Secondary | ICD-10-CM | POA: Diagnosis not present

## 2023-05-27 DIAGNOSIS — H02831 Dermatochalasis of right upper eyelid: Secondary | ICD-10-CM | POA: Diagnosis not present

## 2023-06-12 DIAGNOSIS — R1010 Upper abdominal pain, unspecified: Secondary | ICD-10-CM | POA: Diagnosis not present

## 2023-06-12 DIAGNOSIS — R197 Diarrhea, unspecified: Secondary | ICD-10-CM | POA: Diagnosis not present

## 2023-06-12 DIAGNOSIS — F324 Major depressive disorder, single episode, in partial remission: Secondary | ICD-10-CM | POA: Diagnosis not present

## 2023-06-24 DIAGNOSIS — K9189 Other postprocedural complications and disorders of digestive system: Secondary | ICD-10-CM | POA: Diagnosis not present

## 2023-06-24 DIAGNOSIS — R197 Diarrhea, unspecified: Secondary | ICD-10-CM | POA: Diagnosis not present

## 2023-06-24 DIAGNOSIS — D508 Other iron deficiency anemias: Secondary | ICD-10-CM | POA: Diagnosis not present

## 2023-07-01 DIAGNOSIS — D508 Other iron deficiency anemias: Secondary | ICD-10-CM | POA: Diagnosis not present

## 2023-08-06 DIAGNOSIS — F324 Major depressive disorder, single episode, in partial remission: Secondary | ICD-10-CM | POA: Diagnosis not present

## 2023-08-06 DIAGNOSIS — F5101 Primary insomnia: Secondary | ICD-10-CM | POA: Diagnosis not present

## 2023-08-26 DIAGNOSIS — R1013 Epigastric pain: Secondary | ICD-10-CM | POA: Diagnosis not present

## 2023-08-26 DIAGNOSIS — D508 Other iron deficiency anemias: Secondary | ICD-10-CM | POA: Diagnosis not present

## 2023-08-26 DIAGNOSIS — K9189 Other postprocedural complications and disorders of digestive system: Secondary | ICD-10-CM | POA: Diagnosis not present

## 2023-08-26 DIAGNOSIS — R197 Diarrhea, unspecified: Secondary | ICD-10-CM | POA: Diagnosis not present

## 2023-09-23 DIAGNOSIS — Z1231 Encounter for screening mammogram for malignant neoplasm of breast: Secondary | ICD-10-CM | POA: Diagnosis not present

## 2023-09-27 DIAGNOSIS — I129 Hypertensive chronic kidney disease with stage 1 through stage 4 chronic kidney disease, or unspecified chronic kidney disease: Secondary | ICD-10-CM | POA: Diagnosis not present

## 2023-09-27 DIAGNOSIS — E559 Vitamin D deficiency, unspecified: Secondary | ICD-10-CM | POA: Diagnosis not present

## 2023-09-27 DIAGNOSIS — Z0000 Encounter for general adult medical examination without abnormal findings: Secondary | ICD-10-CM | POA: Diagnosis not present

## 2023-09-27 DIAGNOSIS — B0229 Other postherpetic nervous system involvement: Secondary | ICD-10-CM | POA: Diagnosis not present

## 2023-09-27 DIAGNOSIS — N182 Chronic kidney disease, stage 2 (mild): Secondary | ICD-10-CM | POA: Diagnosis not present

## 2023-09-27 DIAGNOSIS — D509 Iron deficiency anemia, unspecified: Secondary | ICD-10-CM | POA: Diagnosis not present

## 2023-09-27 DIAGNOSIS — M8588 Other specified disorders of bone density and structure, other site: Secondary | ICD-10-CM | POA: Diagnosis not present

## 2023-09-27 DIAGNOSIS — E785 Hyperlipidemia, unspecified: Secondary | ICD-10-CM | POA: Diagnosis not present

## 2023-09-27 DIAGNOSIS — E1121 Type 2 diabetes mellitus with diabetic nephropathy: Secondary | ICD-10-CM | POA: Diagnosis not present

## 2023-09-27 DIAGNOSIS — F5101 Primary insomnia: Secondary | ICD-10-CM | POA: Diagnosis not present

## 2023-09-27 DIAGNOSIS — F324 Major depressive disorder, single episode, in partial remission: Secondary | ICD-10-CM | POA: Diagnosis not present

## 2023-10-15 DIAGNOSIS — E119 Type 2 diabetes mellitus without complications: Secondary | ICD-10-CM | POA: Diagnosis not present

## 2023-10-15 DIAGNOSIS — H52203 Unspecified astigmatism, bilateral: Secondary | ICD-10-CM | POA: Diagnosis not present

## 2023-10-15 DIAGNOSIS — H2513 Age-related nuclear cataract, bilateral: Secondary | ICD-10-CM | POA: Diagnosis not present

## 2023-10-21 DIAGNOSIS — E2839 Other primary ovarian failure: Secondary | ICD-10-CM | POA: Diagnosis not present

## 2023-10-21 DIAGNOSIS — N958 Other specified menopausal and perimenopausal disorders: Secondary | ICD-10-CM | POA: Diagnosis not present

## 2023-10-21 DIAGNOSIS — M8588 Other specified disorders of bone density and structure, other site: Secondary | ICD-10-CM | POA: Diagnosis not present

## 2024-01-29 DIAGNOSIS — M8588 Other specified disorders of bone density and structure, other site: Secondary | ICD-10-CM | POA: Diagnosis not present

## 2024-01-29 DIAGNOSIS — Z79899 Other long term (current) drug therapy: Secondary | ICD-10-CM | POA: Diagnosis not present

## 2024-01-29 DIAGNOSIS — N182 Chronic kidney disease, stage 2 (mild): Secondary | ICD-10-CM | POA: Diagnosis not present

## 2024-01-29 DIAGNOSIS — E785 Hyperlipidemia, unspecified: Secondary | ICD-10-CM | POA: Diagnosis not present

## 2024-01-29 DIAGNOSIS — L130 Dermatitis herpetiformis: Secondary | ICD-10-CM | POA: Diagnosis not present

## 2024-01-29 DIAGNOSIS — F324 Major depressive disorder, single episode, in partial remission: Secondary | ICD-10-CM | POA: Diagnosis not present

## 2024-01-29 DIAGNOSIS — I129 Hypertensive chronic kidney disease with stage 1 through stage 4 chronic kidney disease, or unspecified chronic kidney disease: Secondary | ICD-10-CM | POA: Diagnosis not present

## 2024-01-29 DIAGNOSIS — L570 Actinic keratosis: Secondary | ICD-10-CM | POA: Diagnosis not present

## 2024-01-29 DIAGNOSIS — E1121 Type 2 diabetes mellitus with diabetic nephropathy: Secondary | ICD-10-CM | POA: Diagnosis not present

## 2024-02-07 DIAGNOSIS — M810 Age-related osteoporosis without current pathological fracture: Secondary | ICD-10-CM | POA: Diagnosis not present

## 2024-02-13 DIAGNOSIS — D539 Nutritional anemia, unspecified: Secondary | ICD-10-CM | POA: Diagnosis not present

## 2024-02-19 DIAGNOSIS — M810 Age-related osteoporosis without current pathological fracture: Secondary | ICD-10-CM | POA: Diagnosis not present

## 2024-02-28 DIAGNOSIS — M810 Age-related osteoporosis without current pathological fracture: Secondary | ICD-10-CM | POA: Diagnosis not present

## 2024-03-18 DIAGNOSIS — K9189 Other postprocedural complications and disorders of digestive system: Secondary | ICD-10-CM | POA: Diagnosis not present

## 2024-03-18 DIAGNOSIS — D649 Anemia, unspecified: Secondary | ICD-10-CM | POA: Diagnosis not present

## 2024-03-18 DIAGNOSIS — R197 Diarrhea, unspecified: Secondary | ICD-10-CM | POA: Diagnosis not present

## 2024-03-18 DIAGNOSIS — R1013 Epigastric pain: Secondary | ICD-10-CM | POA: Diagnosis not present

## 2024-06-02 DIAGNOSIS — M8588 Other specified disorders of bone density and structure, other site: Secondary | ICD-10-CM | POA: Diagnosis not present

## 2024-06-02 DIAGNOSIS — N182 Chronic kidney disease, stage 2 (mild): Secondary | ICD-10-CM | POA: Diagnosis not present

## 2024-06-02 DIAGNOSIS — E785 Hyperlipidemia, unspecified: Secondary | ICD-10-CM | POA: Diagnosis not present

## 2024-06-02 DIAGNOSIS — F5101 Primary insomnia: Secondary | ICD-10-CM | POA: Diagnosis not present

## 2024-06-02 DIAGNOSIS — E538 Deficiency of other specified B group vitamins: Secondary | ICD-10-CM | POA: Diagnosis not present

## 2024-06-02 DIAGNOSIS — F324 Major depressive disorder, single episode, in partial remission: Secondary | ICD-10-CM | POA: Diagnosis not present

## 2024-06-02 DIAGNOSIS — D539 Nutritional anemia, unspecified: Secondary | ICD-10-CM | POA: Diagnosis not present

## 2024-06-02 DIAGNOSIS — E1121 Type 2 diabetes mellitus with diabetic nephropathy: Secondary | ICD-10-CM | POA: Diagnosis not present

## 2024-06-02 DIAGNOSIS — I129 Hypertensive chronic kidney disease with stage 1 through stage 4 chronic kidney disease, or unspecified chronic kidney disease: Secondary | ICD-10-CM | POA: Diagnosis not present
